# Patient Record
Sex: Female | Born: 1985 | Race: Black or African American | Hispanic: No | Marital: Single | Smoking: Never smoker
Health system: Southern US, Community
[De-identification: ages and names within clinical notes are randomized; demographics above are authoritative.]

## PROBLEM LIST (undated history)

## (undated) ENCOUNTER — Inpatient Hospital Stay (HOSPITAL_COMMUNITY): Payer: Self-pay

## (undated) DIAGNOSIS — O24419 Gestational diabetes mellitus in pregnancy, unspecified control: Secondary | ICD-10-CM

## (undated) DIAGNOSIS — I10 Essential (primary) hypertension: Secondary | ICD-10-CM

---

## 2009-02-10 HISTORY — PX: WISDOM TOOTH EXTRACTION: SHX21

## 2010-11-19 ENCOUNTER — Inpatient Hospital Stay (INDEPENDENT_AMBULATORY_CARE_PROVIDER_SITE_OTHER)
Admission: RE | Admit: 2010-11-19 | Discharge: 2010-11-19 | Disposition: A | Discharge status: Home / Self Care | Payer: Self-pay | Source: Ambulatory Visit | Attending: Emergency Medicine | Admitting: Emergency Medicine

## 2010-11-19 DIAGNOSIS — Z331 Pregnant state, incidental: Secondary | ICD-10-CM

## 2010-11-19 LAB — POCT URINALYSIS DIP (DEVICE)
Ketones, ur: NEGATIVE mg/dL
Protein, ur: NEGATIVE mg/dL
Specific Gravity, Urine: 1.02 (ref 1.005–1.030)
pH: 6 (ref 5.0–8.0)

## 2011-03-26 LAB — RPR: RPR: NONREACTIVE

## 2011-03-26 LAB — RUBELLA ANTIBODY, IGM: Rubella: IMMUNE

## 2011-03-26 LAB — ANTIBODY SCREEN: Antibody Screen: NEGATIVE

## 2011-04-16 ENCOUNTER — Encounter: Payer: Medicaid Other | Attending: Obstetrics and Gynecology

## 2011-04-16 ENCOUNTER — Ambulatory Visit: Payer: Medicaid Other | Admitting: *Deleted

## 2011-04-23 ENCOUNTER — Other Ambulatory Visit (INDEPENDENT_AMBULATORY_CARE_PROVIDER_SITE_OTHER): Payer: Medicaid Other

## 2011-04-23 ENCOUNTER — Encounter (INDEPENDENT_AMBULATORY_CARE_PROVIDER_SITE_OTHER): Payer: Medicaid Other | Admitting: Registered Nurse

## 2011-04-23 DIAGNOSIS — E669 Obesity, unspecified: Secondary | ICD-10-CM

## 2011-04-23 DIAGNOSIS — O358XX0 Maternal care for other (suspected) fetal abnormality and damage, not applicable or unspecified: Secondary | ICD-10-CM

## 2011-04-23 DIAGNOSIS — Z331 Pregnant state, incidental: Secondary | ICD-10-CM

## 2011-05-07 ENCOUNTER — Encounter (INDEPENDENT_AMBULATORY_CARE_PROVIDER_SITE_OTHER): Payer: Medicaid Other | Admitting: Registered Nurse

## 2011-05-07 ENCOUNTER — Observation Stay (HOSPITAL_COMMUNITY)
Admission: AD | Admit: 2011-05-07 | Discharge: 2011-05-09 | Disposition: A | Discharge status: Home / Self Care | Payer: Medicaid Other | Source: Ambulatory Visit | Attending: Obstetrics and Gynecology | Admitting: Obstetrics and Gynecology

## 2011-05-07 ENCOUNTER — Encounter (HOSPITAL_COMMUNITY): Payer: Self-pay | Admitting: *Deleted

## 2011-05-07 DIAGNOSIS — O99210 Obesity complicating pregnancy, unspecified trimester: Secondary | ICD-10-CM | POA: Diagnosis present

## 2011-05-07 DIAGNOSIS — Z331 Pregnant state, incidental: Secondary | ICD-10-CM

## 2011-05-07 DIAGNOSIS — Z91119 Patient's noncompliance with dietary regimen due to unspecified reason: Secondary | ICD-10-CM

## 2011-05-07 DIAGNOSIS — O24919 Unspecified diabetes mellitus in pregnancy, unspecified trimester: Secondary | ICD-10-CM

## 2011-05-07 DIAGNOSIS — O99810 Abnormal glucose complicating pregnancy: Principal | ICD-10-CM | POA: Insufficient documentation

## 2011-05-07 DIAGNOSIS — Z9111 Patient's noncompliance with dietary regimen: Secondary | ICD-10-CM

## 2011-05-07 DIAGNOSIS — E669 Obesity, unspecified: Secondary | ICD-10-CM | POA: Insufficient documentation

## 2011-05-07 HISTORY — DX: Gestational diabetes mellitus in pregnancy, unspecified control: O24.419

## 2011-05-07 LAB — COMPREHENSIVE METABOLIC PANEL
AST: 22 U/L (ref 0–37)
Alkaline Phosphatase: 78 U/L (ref 39–117)
BUN: 4 mg/dL — ABNORMAL LOW (ref 6–23)
CO2: 21 mEq/L (ref 19–32)
Chloride: 103 mEq/L (ref 96–112)
Creatinine, Ser: 0.56 mg/dL (ref 0.50–1.10)
GFR calc non Af Amer: >90 mL/min (ref >90)
Potassium: 3.5 mEq/L (ref 3.5–5.1)
Total Bilirubin: 0.4 mg/dL (ref 0.3–1.2)

## 2011-05-07 LAB — CBC
HCT: 32.2 % — ABNORMAL LOW (ref 36.0–46.0)
MCV: 80.7 fL (ref 78.0–100.0)
Platelets: 238 10*3/uL (ref 150–400)
RBC: 3.99 MIL/uL (ref 3.87–5.11)
WBC: 8.9 10*3/uL (ref 4.0–10.5)

## 2011-05-07 LAB — GLUCOSE, CAPILLARY
Glucose-Capillary: 143 mg/dL — ABNORMAL HIGH (ref 70–99)
Glucose-Capillary: 126 mg/dL — ABNORMAL HIGH (ref 70–99)

## 2011-05-07 MED ORDER — MENTHOL 3 MG MT LOZG
1.0000 | LOZENGE | OROMUCOSAL | Status: DC | PRN
  Administered 2011-05-08 – 2011-05-09 (×2): 3 mg via ORAL
  Filled 2011-05-07: qty 9

## 2011-05-07 MED ORDER — DOCUSATE SODIUM 100 MG PO CAPS
100.0000 mg | ORAL_CAPSULE | Freq: Every day | ORAL | Status: DC
  Administered 2011-05-08: 100 mg via ORAL
  Filled 2011-05-07: qty 1

## 2011-05-07 MED ORDER — GLYBURIDE 2.5 MG PO TABS
2.5000 mg | ORAL_TABLET | Freq: Every day | ORAL | Status: DC
  Administered 2011-05-08 – 2011-05-09 (×2): 2.5 mg via ORAL
  Filled 2011-05-07 (×3): qty 1

## 2011-05-07 MED ORDER — ACETAMINOPHEN 325 MG PO TABS
650.0000 mg | ORAL_TABLET | ORAL | Status: DC | PRN
  Filled 2011-05-07: qty 2

## 2011-05-07 MED ORDER — ZOLPIDEM TARTRATE 10 MG PO TABS: 10.0000 mg | ORAL_TABLET | Freq: Every evening | ORAL | Status: DC | PRN

## 2011-05-07 MED ORDER — INSULIN REGULAR HUMAN 100 UNIT/ML IJ SOLN
4.0000 [IU] | Freq: Four times a day (QID) | INTRAMUSCULAR | Status: DC | PRN
  Administered 2011-05-07: 4 [IU] via SUBCUTANEOUS
  Administered 2011-05-08: 6 [IU] via SUBCUTANEOUS
  Filled 2011-05-07: qty 0.1

## 2011-05-07 MED ORDER — PRENATAL MULTIVITAMIN CH
1.0000 | ORAL_TABLET | Freq: Every day | ORAL | Status: DC
  Administered 2011-05-08: 1 via ORAL
  Filled 2011-05-07: qty 1

## 2011-05-07 MED ORDER — CALCIUM CARBONATE ANTACID 500 MG PO CHEW: 2.0000 | CHEWABLE_TABLET | ORAL | Status: DC | PRN

## 2011-05-07 NOTE — Plan of Care (Signed)
Problem: Consults Goal: Nutrition Consult-if indicated Outcome: Progressing Diet Education completed 05/07/11

## 2011-05-07 NOTE — Progress Notes (Signed)
  Nutrition Dx: Food and nutrition-related knowledge deficit r/t no previous education aeb newly diagnosed GDM.   Nutrition education consult for Carbohydrate Modified Gestational Diabetic Diet completed.  "Meal  plan for gestational diabetics" handout given to patient.  Basic concepts reviewed.  Questions answered.  Patient verbalizes understanding.     

## 2011-05-07 NOTE — H&P (Signed)
Pam [name] is a 26 y.o. female, F1P0 at 74 1/7 weeks,  presenting for blood sugar monitoring and stabilization from the office.  Routine visit today showed glycosuria, with CBG of 295.  Patient had initial screening CBG earlier in pregnancy of 195--she was referred to Nutritional Management for management of dietary issues, but did not follow-up.  Pregnancy remarkable for: Morbid obesity Gestational diabetes, possibly Type 2 Late to care at 24-25 weeks.  History of present pregnancy: Patient entered care at 24 weeks.  EDC of was established by LMP, and in agreement with initial [place] at 25 weeks.   Further ultrasounds were done at 30 weeks, with normal findings.  Her prenatal course was essentially uncomplicated.   OB History    Grav Para Term Preterm Abortions TAB SAB Ect Mult Living   1 0 0 0 0 0 0 0 0 0      Past Medical History  Diagnosis Date  . Gestational diabetes    Past Surgical History  Procedure Date  . Wisdom tooth extraction 2011   Family History: family history is not on file. Social History:  reports that she has never smoked. She does not have any smokeless tobacco history on file. She reports that she does not drink alcohol or use illicit drugs.      Blood pressure 115/85, pulse 98, temperature 98.4 F (36.9 C), temperature source Oral, resp. rate 18, height 5\' 8"  (1.727 m), weight 304 lb (137.893 kg).  Chest clear Heart RRR without murmur Abd gravid, NT Ext WNL, 1+ edema  FHR reactice, no decels No contractions.  Prenatal labs: ABO, Rh:   Antibody:   Rubella:   RPR:    HBsAg:    HIV:    GBS:    Sickle cell   Assessment/Plan: IUP at 31 1/7 weeks Diabetes, gestational vs Type 2 Morbid obesity  Plan: Admit to Antenatal per consult with Dr. Normand Sloop CBG now and postprandial--if > or = 200, will give 10 units Regular insulin Nutritional consult Modified Carb diet NST TID Check CBC, CMET--will also get Hgb A1C Patient would prefer po med to  insulin--reviewed need for narrow blood sugar control for maternal/fetal safety, with utilization of po meds and insulin discussed.  Nigel Bridgeman 05/07/2011, 12:16 PM

## 2011-05-08 DIAGNOSIS — O24919 Unspecified diabetes mellitus in pregnancy, unspecified trimester: Secondary | ICD-10-CM

## 2011-05-08 DIAGNOSIS — Z9111 Patient's noncompliance with dietary regimen: Secondary | ICD-10-CM

## 2011-05-08 LAB — GLUCOSE, CAPILLARY
Glucose-Capillary: 114 mg/dL — ABNORMAL HIGH (ref 70–99)
Glucose-Capillary: 145 mg/dL — ABNORMAL HIGH (ref 70–99)

## 2011-05-08 NOTE — Progress Notes (Addendum)
ADA Standards of Care 2012:  Dabetes in Pregnancy Target Ranges: Fasting - 60-90 mg/dL  Preprandial- 40-981 mg/dL Postprandial < 191 mg/dL (7.8 mmol/L)  Please order a HgbA1C to establish initial control unless already performed. Plan to see patient this afternoon or tomorrow am.  Ordered RD consult and noted her visit and education. Have ordered education per bedside RN to assist patient to watch DM videos per System education network. Patient can practice monitoring her glucose as well with hospital meter. Medicaid will provide patient with Accucheck meter and strips; will need to write Rx for meter and monitoring q am, 2 hrs pp, and HS (? If desired). May also want to check a 2 am glucose while hospitalized to be sure she is not dropping and rebounding in the am. Urine ketone strips would also be helpful at home to check in the am when fasting glucose is elevated (greater than 100-120 mg/dL) Thank you, Lenor Coffin, RN, CNS, Diabetes Coordinator [phone]))

## 2011-05-08 NOTE — Progress Notes (Signed)
Pam [name] is a 26 y.o. G1P0000 at [redacted]w[redacted]d by ultrasound admitted for management on diabetes, cannot rule out pre-pregnancy diabetes  Subjective: GI: negative GU: Denies: dysuria, frequency/urgency, hematuria, genital discharge, vaginal bleeding, pelvic pain OB: good fetal movement        Objective: BP 127/68  Pulse 89  Temp(Src) 98.4 F (36.9 C) (Oral)  Resp 18  Ht 5\' 8"  (1.727 m)  Wt 304 lb (137.893 kg)  BMI 46.22 kg/m2    FBS 114  FHT:  FHR: 130's bpm, variability: moderate,  accelerations:  Present,  decelerations:  Absent UC:   none SVE:  deferred    Labs: Lab Results  Component Value Date   WBC 8.9 05/07/2011   HGB 11.0* 05/07/2011   HCT 32.2* 05/07/2011   MCV 80.7 05/07/2011   PLT 238 05/07/2011    Assessment / Plan: GDM cannont rule out pre-pregnancy diabetes Morbid obesity Noncompliance with recommended appointment to [facility]   Plan: Nutrition instructions given, pt voices understanding 1st dose glyburide given this am FBS and 2hr pc  Fetal Wellbeing:  Category II   Vaun Hyndman P 05/08/2011, 9:40 AM

## 2011-05-08 NOTE — Progress Notes (Signed)
UR Chart review completed.  

## 2011-05-08 NOTE — Progress Notes (Signed)
PT GIVEN GESTATIONAL DIABETES BOOKLET AND INSTRUCTED ON USE OF EDUCATION CHANNEL, AT PRESENT WATCHING THE VIDEO ON MANAGING DIABETES, INSTRUCTED ON WHICH SPECIFIC VIDEOS TO WATCH. INCLUDING CARB COUNTING.ALL QUESTIONS ANSWERED. PT VERBALIZED ALL UNDERSTANDING.

## 2011-05-09 DIAGNOSIS — O24919 Unspecified diabetes mellitus in pregnancy, unspecified trimester: Secondary | ICD-10-CM

## 2011-05-09 LAB — GLUCOSE, CAPILLARY
Glucose-Capillary: 95 mg/dL (ref 70–99)
Glucose-Capillary: 153 mg/dL — ABNORMAL HIGH (ref 70–99)
Glucose-Capillary: 75 mg/dL (ref 70–99)

## 2011-05-09 MED ORDER — GLYBURIDE 2.5 MG PO TABS
2.5000 mg | ORAL_TABLET | ORAL | Status: AC
  Administered 2011-05-09: 2.5 mg via ORAL
  Filled 2011-05-09: qty 1

## 2011-05-09 MED ORDER — GLYBURIDE 5 MG PO TABS: 5.0000 mg | ORAL_TABLET | Freq: Every day | ORAL | Status: DC

## 2011-05-09 NOTE — Progress Notes (Signed)
ADA Standards of Care 2012:  Dabetes in Pregnancy Target Ranges: Fasting - 60-90 mg/dL  Preprandial- 16-109 mg/dL Postprandial < 604 mg/dL (7.8 mmol/L)  Reviewed glucose levels.  Might need to increase the diabeta to 5 mg in the am to cover the meals, but overall the glucose levels are close to target.  Pt will need to monitor closely, as her potential need for insulin is probably soon to be a factor.  Glad to help.  Would suggest starting with NPH at HS if 5 mg Diabeta does not normalize fasting gluocose less than or equal to 90 mg/dL Thank you, Lenor Coffin, RN, CNS, Diabetes Coordinator [phone])  .

## 2011-05-09 NOTE — Discharge Instructions (Signed)
Gestational Diabetes Mellitus Gestational diabetes mellitus (GDM) is diabetes that occurs only during pregnancy. This happens when the body cannot properly handle the glucose (sugar) that increases in the blood after eating. During pregnancy, insulin resistance (reduced sensitivity to insulin) occurs because of the release of hormones from the placenta. Usually, the pancreas of pregnant women produces enough insulin to overcome the resistance that occurs. However, in gestational diabetes, the insulin is there but it does not work effectively. If the resistance is severe enough that the pancreas does not produce enough insulin, extra glucose builds up in the blood.  WHO IS AT RISK FOR DEVELOPING GESTATIONAL DIABETES?  Women with a history of diabetes in the family.   Women over age 25.   Women who are overweight.   Women in certain ethnic groups (Hispanic, African American, Native American, Asian and Pacific Islander).  WHAT CAN HAPPEN TO THE BABY? If the mother's blood glucose is too high while she is pregnant, the extra sugar will travel through the umbilical cord to the baby. Some of the problems the baby may have are:  Large Baby - If the baby receives too much sugar, the baby will gain more weight. This may cause the baby to be too large to be born normally (vaginally) and a Cesarean section (C-section) may be needed.   Low Blood Glucose (hypoglycemia) - The baby makes extra insulin, in response to the extra sugar its gets from its mother. When the baby is born and no longer needs this extra insulin, the baby's blood glucose level may drop.   Jaundice (yellow coloring of the skin and eyes) - This is fairly common in babies. It is caused from a build-up of the chemical called bilirubin. This is rarely serious, but is seen more often in babies whose mothers had gestational diabetes.  RISKS TO THE MOTHER Women who have had gestational diabetes may be at higher risk for some problems,  including:  Preeclampsia or toxemia, which includes problems with high blood pressure. Blood pressure and protein levels in the urine must be checked frequently.   Infections.   Cesarean section (C-section) for delivery.   Developing Type 2 diabetes later in life. About 30-50% will develop diabetes later, especially if obese.  DIAGNOSIS  The hormones that cause insulin resistance are highest at about 24-28 weeks of pregnancy. If symptoms are experienced, they are much like symptoms you would normally expect during pregnancy.  GDM is often diagnosed using a two part method: 1. After 24-28 weeks of pregnancy, the woman drinks a glucose solution and takes a blood test. If the glucose level is high, a second test will be given.  2. Oral Glucose Tolerance Test (OGTT) which is 3 hours long - After not eating overnight, the blood glucose is checked. The woman drinks a glucose solution, and hourly blood glucose tests are taken.  If the woman has risk factors for GDM, the caregiver may test earlier than 24 weeks of pregnancy. TREATMENT  Treatment of GDM is directed at keeping the mother's blood glucose level normal, and may include:  Meal planning.   Taking insulin or other medicine to control your blood glucose level.   Exercise.   Keeping a daily record of the foods you eat.   Blood glucose monitoring and keeping a record of your blood glucose levels.   May monitor ketone levels in the urine, although this is no longer considered necessary in most pregnancies.  HOME CARE INSTRUCTIONS  While you are pregnant:    Follow your caregiver's advice regarding your prenatal appointments, meal planning, exercise, medicines, vitamins, blood and other tests, and physical activities.   Keep a record of your meals, blood glucose tests, and the amount of insulin you are taking (if any). Show this to your caregiver at every prenatal visit.   If you have GDM, you may have problems with hypoglycemia (low  blood glucose). You may suspect this if you become suddenly dizzy, feel shaky, and/or weak. If you think this is happening and you have a glucose meter, try to test your blood glucose level. Follow your caregiver's advice for when and how to treat your low blood glucose. Generally, the 15:15 rule is followed: Treat by consuming 15 grams of carbohydrates, wait 15 minutes, and recheck blood glucose. Examples of 15 grams of carbohydrates are:   1 cup skim or low-fat milk.    cup juice.   3-4 glucose tablets.   5-6 hard candies.   1 small box raisins.    cup regular soda pop.   Practice good hygiene, to avoid infections.   Do not smoke.  SEEK MEDICAL CARE IF:   You develop abnormal vaginal discharge, with or without itching.   You become weak and tired more than expected.   You seem to sweat a lot.   You have a sudden increase in weight, 5 pounds or more in one week.   You are losing weight, 3 pounds or more in a week.   Your blood glucose level is high, and you need instructions on what to do about it.  SEEK IMMEDIATE MEDICAL CARE IF:   You develop a severe headache.   You faint or pass out.   You develop nausea and vomiting.   You become disoriented or confused.   You have a convulsion.   You develop vision problems.   You develop stomach pain.   You develop vaginal bleeding.   You develop uterine contractions.   You have leaking or a gush of fluid from the vagina.  AFTER YOU HAVE THE BABY:  Go to all of your follow-up appointments, and have blood tests as advised by your caregiver.   Maintain a healthy lifestyle, to prevent diabetes in the future. This includes:   Following a healthy meal plan.   Controlling your weight.   Getting enough exercise and proper rest.   Do not smoke.   Breastfeed your baby if you can. This will lower the chance of you and your baby developing diabetes later in life.  For more information about diabetes, go to the American  Diabetes Association at: www.americandiabetesassociation.org. For more information about gestational diabetes, go to the American Congress of Obstetricians and Gynecologists at: www.acog.org. Document Released: 05/05/2000 Document Revised: 01/16/2011 Document Reviewed: 11/27/2008 ExitCare Patient Information 2012 [employer]. 

## 2011-05-09 NOTE — Discharge Summary (Signed)
Physician Discharge Summary  Patient ID: Pam [name] MRN: [id number] DOB/AGE: May 17, 1985 26 y.o.  Admit date: 05/07/2011 Discharge date: 05/09/2011  Admission Diagnoses:  31 3/7 weeks                                          Elevated blood sugar                                           Morbid obesity                                          Dietary non-compliance  Discharge Diagnoses:  Active Problems:  Diabetes in pregnancy  Maternal morbid obesity, antepartum  Dietary noncompliance   Discharged Condition: stable  Hospital Course: Admitted on 05/07/11 from the office with a CBG of 295.  Had previously had a CBG of 190, with referral to [facility] in lieu of further diabetic testing.  Patient had not been checking her blood sugars and had not been following any dietary guidelines for glucose management. During her admission, CBGs were monitored and the patient was begun on Glyburide 2.5 mg po q day.  Her fasting CBGs were generally WNL, but her 2 hour pp were mildly elevated (see hospital notes).  FHR was reassuring on NST TID, and other VS were WNL.  Nutritional consult and dietary teaching was completed, and the diabetic CNS also saw the patient.  On 05/09/11, patient demonstrated the ability to check her CBG without difficulty.  Dr. Stefano Gaul was consulted, and the patient was discharged home in stable condition.  She was to monitor her CBGs fasting and 2 hour pp, take Glyburide 5 mg po q day, maintain dietary control of carbohydrate intake, and follow-up in the office in 1 week.  Patient seemed to understand these instructions and was discharged home.  Consults: Nutritionist, diabetic CNS  Significant Diagnostic Studies: labs: CBGs  Treatments: None  Discharge Exam: Blood pressure 123/73, pulse 106, temperature 98.3 F (36.8 C), temperature source Oral, resp. rate 18, height 5\' 8"  (1.727 m), weight 304 lb (137.893 kg). General appearance: alert Chest wall: no tenderness Breasts: normal  appearance, no masses or tenderness Cardio: regular rate and rhythm, S1, S2 normal, no murmur, click, rub or gallop GI: gravid, NT Extremities: extremities normal, atraumatic, no cyanosis or edema FHR reactive on am tracing, no significant contrations  Disposition: Stable  Medication List  As of 05/09/2011 12:52 PM   TAKE these medications         glyBURIDE 5 MG tablet   Commonly known as: DIABETA   Take 1 tablet (5 mg total) by mouth daily with breakfast.      prenatal multivitamin Tabs   Take 1 tablet by mouth daily.           Follow-up Information    Follow up with [facility] in 1 week. (Office will call you on Monday to make appointment for later this week..  Call with any questions.)          Signed: Nigel Bridgeman 05/09/2011, 12:52 PM

## 2011-05-09 NOTE — Progress Notes (Signed)
Hospital day # 2 pregnancy at [redacted]w[redacted]d--admitted for glucose control  S: Well, reports good fetal activity      Contractions: None per patient      Vaginal bleeding: None       Vaginal discharge: None         On Glyburide 2.5 mg po q day with breakfast  O: BP 139/81  Pulse 96  Temp(Src) 98.4 F (36.9 C) (Oral)  Resp 20  Ht 5\' 8"  (1.727 m)  Wt 304 lb (137.893 kg)  BMI 46.22 kg/m2      Fetal tracings: Reassuring, with scattered accels on TID NST, occasional mild variables      Mild irritability noted      Uterus non-tender      Extremities: no significant edema and no signs of DVT  Results for DAVIELLE, LINGELBACH (MRN [id number]) as of 05/09/2011 11:36  Ref. Range 05/07/2011 12:08 05/07/2011 15:33 05/07/2011 20:03 05/08/2011 06:01 05/08/2011 10:47 05/08/2011 15:28 05/08/2011 20:48 05/09/2011 06:03 05/09/2011 10:41  GlucoseCapillary Latest Range: 70-99 mg/dL 409 (H) 811 (H) 914 (H) 114 (H) 145 (H) 74 114 (H) 95 153 (H)   FBS range 95-114 2 hr pp breakfast 145-153 2 hr pp lunch 74-143 2 hr pp dinner 114-126  A: [redacted]w[redacted]d with diabetes--gestational vs Type 2     On glyburide since 05/07/11     Previous elevated CBG--non-compliant with monitoring  P:  Reviewed status with patient--she hopes for d/c today.       Has not performed self CBGs.  Seems to understand rationale for CBG control and dietary management.       Will consult with Dr. Stefano Gaul regarding plan.  Nigel Bridgeman  MD 05/09/2011 11:31 AM

## 2011-06-02 ENCOUNTER — Encounter: Payer: Medicaid Other | Admitting: Obstetrics and Gynecology

## 2011-06-12 ENCOUNTER — Encounter: Payer: Self-pay | Admitting: Obstetrics and Gynecology

## 2011-06-16 ENCOUNTER — Telehealth: Payer: Self-pay | Admitting: Obstetrics and Gynecology

## 2011-06-16 NOTE — Telephone Encounter (Signed)
cht found for tom.

## 2011-06-17 ENCOUNTER — Encounter: Payer: Self-pay | Admitting: Obstetrics and Gynecology

## 2011-06-17 ENCOUNTER — Ambulatory Visit (INDEPENDENT_AMBULATORY_CARE_PROVIDER_SITE_OTHER): Payer: Medicaid Other | Admitting: Obstetrics and Gynecology

## 2011-06-17 VITALS — BP 118/78 | Wt 300.0 lb

## 2011-06-17 DIAGNOSIS — O99810 Abnormal glucose complicating pregnancy: Secondary | ICD-10-CM

## 2011-06-17 DIAGNOSIS — O24919 Unspecified diabetes mellitus in pregnancy, unspecified trimester: Secondary | ICD-10-CM

## 2011-06-17 DIAGNOSIS — Z331 Pregnant state, incidental: Secondary | ICD-10-CM

## 2011-06-17 DIAGNOSIS — O24419 Gestational diabetes mellitus in pregnancy, unspecified control: Secondary | ICD-10-CM

## 2011-06-17 LAB — OB RESULTS CONSOLE GBS: GBS: POSITIVE

## 2011-06-17 LAB — POCT CBG (FASTING - GLUCOSE)-MANUAL ENTRY: Glucose Fasting, POC: 114 mg/dL — AB (ref 70–99)

## 2011-06-17 MED ORDER — TERCONAZOLE 0.4 % VA CREA: 1.0000 | TOPICAL_CREAM | Freq: Every day | VAGINAL | Status: AC

## 2011-06-17 MED ORDER — FREESTYLE SYSTEM KIT: 1.0000 | PACK | Status: DC | PRN

## 2011-06-17 MED ORDER — GLUCOSE BLOOD VI STRP: ORAL_STRIP | Status: DC

## 2011-06-17 NOTE — Progress Notes (Signed)
Pt unable to purchase glucose monitor to check CBGs

## 2011-06-17 NOTE — Progress Notes (Signed)
A/P GBS done with cultures Fetal kick counts reviewed Labor reviewed with pt All patients  questions answered rx sent for glucometer and test strips Pt told risks of maternal and fetal morbidity if blood sugar not checked. Check cbg this visit ate two hours ago terazol for yeast vaginitis CBG 116

## 2011-06-17 NOTE — Progress Notes (Signed)
Addended by: Rolla Plate on: 06/17/2011 10:55 AM   Modules accepted: Orders

## 2011-06-17 NOTE — Patient Instructions (Signed)
Fetal Movement Counts Patient Name: __________________________________________________ Patient Due Date: ____________________ Kick counts is highly recommended in high risk pregnancies, but it is a good idea for every pregnant woman to do. Start counting fetal movements at 28 weeks of the pregnancy. Fetal movements increase after eating a full meal or eating or drinking something sweet (the blood sugar is higher). It is also important to drink plenty of fluids (well hydrated) before doing the count. Lie on your left side because it helps with the circulation or you can sit in a comfortable chair with your arms over your belly (abdomen) with no distractions around you. DOING THE COUNT  Try to do the count the same time of day each time you do it.   Mark the day and time, then see how long it takes for you to feel 10 movements (kicks, flutters, swishes, rolls). You should have at least 10 movements within 2 hours. You will most likely feel 10 movements in much less than 2 hours. If you do not, wait an hour and count again. After a couple of days you will see a pattern.   What you are looking for is a change in the pattern or not enough counts in 2 hours. Is it taking longer in time to reach 10 movements?  SEEK MEDICAL CARE IF:  You feel less than 10 counts in 2 hours. Tried twice.   No movement in one hour.   The pattern is changing or taking longer each day to reach 10 counts in 2 hours.   You feel the baby is not moving as it usually does.  Date: ____________ Movements: ____________ Start time: ____________ Finish time: ____________  Date: ____________ Movements: ____________ Start time: ____________ Finish time: ____________ Date: ____________ Movements: ____________ Start time: ____________ Finish time: ____________ Date: ____________ Movements: ____________ Start time: ____________ Finish time: ____________ Date: ____________ Movements: ____________ Start time: ____________ Finish time:  ____________ Date: ____________ Movements: ____________ Start time: ____________ Finish time: ____________ Date: ____________ Movements: ____________ Start time: ____________ Finish time: ____________ Date: ____________ Movements: ____________ Start time: ____________ Finish time: ____________  Date: ____________ Movements: ____________ Start time: ____________ Finish time: ____________ Date: ____________ Movements: ____________ Start time: ____________ Finish time: ____________ Date: ____________ Movements: ____________ Start time: ____________ Finish time: ____________ Date: ____________ Movements: ____________ Start time: ____________ Finish time: ____________ Date: ____________ Movements: ____________ Start time: ____________ Finish time: ____________ Date: ____________ Movements: ____________ Start time: ____________ Finish time: ____________ Date: ____________ Movements: ____________ Start time: ____________ Finish time: ____________  Date: ____________ Movements: ____________ Start time: ____________ Finish time: ____________ Date: ____________ Movements: ____________ Start time: ____________ Finish time: ____________ Date: ____________ Movements: ____________ Start time: ____________ Finish time: ____________ Date: ____________ Movements: ____________ Start time: ____________ Finish time: ____________ Date: ____________ Movements: ____________ Start time: ____________ Finish time: ____________ Date: ____________ Movements: ____________ Start time: ____________ Finish time: ____________ Date: ____________ Movements: ____________ Start time: ____________ Finish time: ____________  Date: ____________ Movements: ____________ Start time: ____________ Finish time: ____________ Date: ____________ Movements: ____________ Start time: ____________ Finish time: ____________ Date: ____________ Movements: ____________ Start time: ____________ Finish time: ____________ Date: ____________ Movements:  ____________ Start time: ____________ Finish time: ____________ Date: ____________ Movements: ____________ Start time: ____________ Finish time: ____________ Date: ____________ Movements: ____________ Start time: ____________ Finish time: ____________ Date: ____________ Movements: ____________ Start time: ____________ Finish time: ____________  Date: ____________ Movements: ____________ Start time: ____________ Finish time: ____________ Date: ____________ Movements: ____________ Start time: ____________ Finish time: ____________ Date: ____________ Movements: ____________ Start time:   ____________ Finish time: ____________ Date: ____________ Movements: ____________ Start time: ____________ Finish time: ____________ Date: ____________ Movements: ____________ Start time: ____________ Finish time: ____________ Date: ____________ Movements: ____________ Start time: ____________ Finish time: ____________ Date: ____________ Movements: ____________ Start time: ____________ Finish time: ____________  Date: ____________ Movements: ____________ Start time: ____________ Finish time: ____________ Date: ____________ Movements: ____________ Start time: ____________ Finish time: ____________ Date: ____________ Movements: ____________ Start time: ____________ Finish time: ____________ Date: ____________ Movements: ____________ Start time: ____________ Finish time: ____________ Date: ____________ Movements: ____________ Start time: ____________ Finish time: ____________ Date: ____________ Movements: ____________ Start time: ____________ Finish time: ____________ Date: ____________ Movements: ____________ Start time: ____________ Finish time: ____________  Date: ____________ Movements: ____________ Start time: ____________ Finish time: ____________ Date: ____________ Movements: ____________ Start time: ____________ Finish time: ____________ Date: ____________ Movements: ____________ Start time: ____________ Finish  time: ____________ Date: ____________ Movements: ____________ Start time: ____________ Finish time: ____________ Date: ____________ Movements: ____________ Start time: ____________ Finish time: ____________ Date: ____________ Movements: ____________ Start time: ____________ Finish time: ____________ Date: ____________ Movements: ____________ Start time: ____________ Finish time: ____________  Date: ____________ Movements: ____________ Start time: ____________ Finish time: ____________ Date: ____________ Movements: ____________ Start time: ____________ Finish time: ____________ Date: ____________ Movements: ____________ Start time: ____________ Finish time: ____________ Date: ____________ Movements: ____________ Start time: ____________ Finish time: ____________ Date: ____________ Movements: ____________ Start time: ____________ Finish time: ____________ Date: ____________ Movements: ____________ Start time: ____________ Finish time: ____________ Document Released: 02/26/2006 Document Revised: 01/16/2011 Document Reviewed: 08/29/2008 ExitCare Patient Information 2012 [employer]. 

## 2011-06-22 LAB — CULTURE, BETA STREP (GROUP B ONLY)

## 2011-06-24 ENCOUNTER — Ambulatory Visit (INDEPENDENT_AMBULATORY_CARE_PROVIDER_SITE_OTHER): Payer: Medicaid Other | Admitting: Obstetrics and Gynecology

## 2011-06-24 ENCOUNTER — Ambulatory Visit (INDEPENDENT_AMBULATORY_CARE_PROVIDER_SITE_OTHER): Payer: Medicaid Other

## 2011-06-24 ENCOUNTER — Other Ambulatory Visit: Payer: Self-pay | Admitting: Obstetrics and Gynecology

## 2011-06-24 ENCOUNTER — Other Ambulatory Visit: Payer: Self-pay

## 2011-06-24 ENCOUNTER — Encounter: Payer: Self-pay | Admitting: Obstetrics and Gynecology

## 2011-06-24 VITALS — BP 128/80 | Wt 307.0 lb

## 2011-06-24 DIAGNOSIS — Z331 Pregnant state, incidental: Secondary | ICD-10-CM

## 2011-06-24 DIAGNOSIS — O24919 Unspecified diabetes mellitus in pregnancy, unspecified trimester: Secondary | ICD-10-CM

## 2011-06-24 DIAGNOSIS — O0990 Supervision of high risk pregnancy, unspecified, unspecified trimester: Secondary | ICD-10-CM

## 2011-06-24 LAB — US OB FOLLOW UP

## 2011-06-24 NOTE — Progress Notes (Signed)
Addended by: Janine Limbo on: 06/24/2011 02:18 PM   Modules accepted: Orders, Level of Service

## 2011-06-24 NOTE — Progress Notes (Signed)
Patient ID: Pam [name], female   DOB: 1985/10/25, 26 y.o.   MRN: [id number] GBS positive.  Plan to treat in labor

## 2011-06-24 NOTE — Progress Notes (Signed)
The patient has not checked her blood sugars because she does not have a monitor.  She is scheduled to see the nutritionist tomorrow and a free monitor will be provided.  She takes glyburide 5 mg each day.  Ultrasound: Single gestation, vertex, normal fluid, 71st percentile for growth.  BPP 8 out of 8.  Return office in one week.  BPP next week.  The patient will monitor her blood sugars.  Dr. Stefano Gaul

## 2011-07-02 ENCOUNTER — Encounter: Payer: Medicaid Other | Attending: Obstetrics and Gynecology

## 2011-07-02 NOTE — Telephone Encounter (Signed)
Jackie/AR pt/missed appt today

## 2011-07-03 ENCOUNTER — Telehealth: Payer: Self-pay | Admitting: Obstetrics and Gynecology

## 2011-07-03 NOTE — Telephone Encounter (Signed)
Pt did not keep appt.  Message has been left for pt to R/S but has not called to do so.

## 2011-07-04 ENCOUNTER — Ambulatory Visit (INDEPENDENT_AMBULATORY_CARE_PROVIDER_SITE_OTHER): Payer: Medicaid Other | Admitting: Obstetrics and Gynecology

## 2011-07-04 ENCOUNTER — Other Ambulatory Visit: Payer: Self-pay | Admitting: Obstetrics and Gynecology

## 2011-07-04 ENCOUNTER — Other Ambulatory Visit (INDEPENDENT_AMBULATORY_CARE_PROVIDER_SITE_OTHER): Payer: Medicaid Other

## 2011-07-04 VITALS — BP 114/70 | Wt 305.0 lb

## 2011-07-04 DIAGNOSIS — O99810 Abnormal glucose complicating pregnancy: Secondary | ICD-10-CM

## 2011-07-04 DIAGNOSIS — O24419 Gestational diabetes mellitus in pregnancy, unspecified control: Secondary | ICD-10-CM

## 2011-07-04 NOTE — Progress Notes (Signed)
No complaints GBS + Ultrasound today with an AFI of 15.2 cm fetal heart rate 135, vertex, posterior placenta, normal fluid, biophysical profile 8 out of 8 Return to office in one week for biophysical profile and visit and will start the Induction scheduling process for 41 weeks. GDM on 5 mg of glyburide Q am.  Patient does not have CBCs with her.  She just started taking them yesterday.

## 2011-07-04 NOTE — Progress Notes (Signed)
Pt has no complaints. Pt wants cx checked/ JM

## 2011-07-08 ENCOUNTER — Telehealth: Payer: Self-pay

## 2011-07-08 NOTE — Telephone Encounter (Signed)
LM for pt to cb re: her missing her appt with [facility] for nutritional counselling. On the 22nd May. I asked her to call me to get her r/s. Melody Comas A

## 2011-07-10 ENCOUNTER — Telehealth: Payer: Self-pay | Admitting: Obstetrics and Gynecology

## 2011-07-10 ENCOUNTER — Ambulatory Visit (INDEPENDENT_AMBULATORY_CARE_PROVIDER_SITE_OTHER): Payer: Medicaid Other | Admitting: Obstetrics and Gynecology

## 2011-07-10 ENCOUNTER — Encounter: Payer: Self-pay | Admitting: Obstetrics and Gynecology

## 2011-07-10 ENCOUNTER — Inpatient Hospital Stay (HOSPITAL_COMMUNITY)
Admission: AD | Admit: 2011-07-10 | Discharge: 2011-07-15 | DRG: 765 | Disposition: A | Discharge status: Home / Self Care | Payer: Medicaid Other | Source: Ambulatory Visit | Attending: Obstetrics and Gynecology | Admitting: Obstetrics and Gynecology

## 2011-07-10 ENCOUNTER — Encounter (HOSPITAL_COMMUNITY): Payer: Self-pay | Admitting: *Deleted

## 2011-07-10 VITALS — BP 120/80 | Wt 305.0 lb

## 2011-07-10 DIAGNOSIS — O99810 Abnormal glucose complicating pregnancy: Secondary | ICD-10-CM

## 2011-07-10 DIAGNOSIS — Z22330 Carrier of Group B streptococcus: Secondary | ICD-10-CM

## 2011-07-10 DIAGNOSIS — E119 Type 2 diabetes mellitus without complications: Secondary | ICD-10-CM | POA: Diagnosis present

## 2011-07-10 DIAGNOSIS — Z331 Pregnant state, incidental: Secondary | ICD-10-CM | POA: Insufficient documentation

## 2011-07-10 DIAGNOSIS — E669 Obesity, unspecified: Secondary | ICD-10-CM | POA: Diagnosis present

## 2011-07-10 DIAGNOSIS — O9902 Anemia complicating childbirth: Secondary | ICD-10-CM | POA: Diagnosis present

## 2011-07-10 DIAGNOSIS — O2432 Unspecified pre-existing diabetes mellitus in childbirth: Secondary | ICD-10-CM | POA: Diagnosis present

## 2011-07-10 DIAGNOSIS — O24919 Unspecified diabetes mellitus in pregnancy, unspecified trimester: Secondary | ICD-10-CM | POA: Diagnosis present

## 2011-07-10 DIAGNOSIS — O24419 Gestational diabetes mellitus in pregnancy, unspecified control: Secondary | ICD-10-CM

## 2011-07-10 DIAGNOSIS — D573 Sickle-cell trait: Secondary | ICD-10-CM | POA: Diagnosis present

## 2011-07-10 DIAGNOSIS — O99892 Other specified diseases and conditions complicating childbirth: Secondary | ICD-10-CM | POA: Diagnosis present

## 2011-07-10 DIAGNOSIS — O99820 Streptococcus B carrier state complicating pregnancy: Secondary | ICD-10-CM

## 2011-07-10 DIAGNOSIS — Z98891 History of uterine scar from previous surgery: Secondary | ICD-10-CM

## 2011-07-10 LAB — COMPREHENSIVE METABOLIC PANEL
ALT: 16 U/L (ref 0–35)
AST: 17 U/L (ref 0–37)
Albumin: 2.3 g/dL — ABNORMAL LOW (ref 3.5–5.2)
Alkaline Phosphatase: 147 U/L — ABNORMAL HIGH (ref 39–117)
BUN: 7 mg/dL (ref 6–23)
Chloride: 101 mEq/L (ref 96–112)
Potassium: 4.2 mEq/L (ref 3.5–5.1)
Sodium: 135 mEq/L (ref 135–145)
Total Bilirubin: 0.4 mg/dL (ref 0.3–1.2)

## 2011-07-10 LAB — BASIC METABOLIC PANEL
Calcium: 9.1 mg/dL (ref 8.4–10.5)
Creatinine, Ser: 0.74 mg/dL (ref 0.50–1.10)
GFR calc non Af Amer: >90 mL/min (ref >90)
Sodium: 135 mEq/L (ref 135–145)

## 2011-07-10 LAB — CBC
MCHC: 33.5 g/dL (ref 30.0–36.0)
RDW: 14.1 % (ref 11.5–15.5)

## 2011-07-10 LAB — GLUCOSE, CAPILLARY: Glucose-Capillary: 62 mg/dL — ABNORMAL LOW (ref 70–99)

## 2011-07-10 LAB — RPR: RPR Ser Ql: NONREACTIVE

## 2011-07-10 MED ORDER — LACTATED RINGERS IV SOLN
INTRAVENOUS | Status: DC
  Administered 2011-07-10 – 2011-07-11 (×2): via INTRAVENOUS
  Administered 2011-07-11: 125 mL/h via INTRAVENOUS

## 2011-07-10 MED ORDER — ZOLPIDEM TARTRATE 10 MG PO TABS: 10.0000 mg | ORAL_TABLET | Freq: Every evening | ORAL | Status: DC | PRN

## 2011-07-10 MED ORDER — OXYTOCIN BOLUS FROM INFUSION
500.0000 mL | Freq: Once | INTRAVENOUS | Status: DC
  Filled 2011-07-10: qty 500

## 2011-07-10 MED ORDER — MISOPROSTOL 25 MCG QUARTER TABLET
25.0000 ug | ORAL_TABLET | ORAL | Status: AC | PRN
  Administered 2011-07-10 – 2011-07-11 (×4): 25 ug via VAGINAL
  Filled 2011-07-10 (×4): qty 0.25

## 2011-07-10 MED ORDER — PENICILLIN G POTASSIUM 5000000 UNITS IJ SOLR
5.0000 10*6.[IU] | Freq: Once | INTRAVENOUS | Status: AC
  Administered 2011-07-10: 5 10*6.[IU] via INTRAVENOUS
  Filled 2011-07-10: qty 5

## 2011-07-10 MED ORDER — ACETAMINOPHEN 325 MG PO TABS: 650.0000 mg | ORAL_TABLET | ORAL | Status: DC | PRN

## 2011-07-10 MED ORDER — ONDANSETRON HCL 4 MG/2ML IJ SOLN: 4.0000 mg | Freq: Four times a day (QID) | INTRAMUSCULAR | Status: DC | PRN

## 2011-07-10 MED ORDER — DEXTROSE 5 % IV SOLN
2.5000 10*6.[IU] | INTRAVENOUS | Status: DC
  Administered 2011-07-10 – 2011-07-12 (×13): 2.5 10*6.[IU] via INTRAVENOUS
  Filled 2011-07-10 (×16): qty 2.5

## 2011-07-10 MED ORDER — BUTORPHANOL TARTRATE 2 MG/ML IJ SOLN: 1.0000 mg | INTRAMUSCULAR | Status: DC | PRN

## 2011-07-10 MED ORDER — TERBUTALINE SULFATE 1 MG/ML IJ SOLN: 0.2500 mg | Freq: Once | INTRAMUSCULAR | Status: AC | PRN

## 2011-07-10 MED ORDER — CITRIC ACID-SODIUM CITRATE 334-500 MG/5ML PO SOLN
30.0000 mL | ORAL | Status: DC | PRN
  Administered 2011-07-12: 30 mL via ORAL
  Filled 2011-07-10: qty 15

## 2011-07-10 MED ORDER — OXYTOCIN 20 UNITS IN LACTATED RINGERS INFUSION - SIMPLE: 125.0000 mL/h | Freq: Once | INTRAVENOUS | Status: DC

## 2011-07-10 MED ORDER — LACTATED RINGERS IV SOLN
500.0000 mL | INTRAVENOUS | Status: DC | PRN
  Administered 2011-07-12 (×2): 1000 mL via INTRAVENOUS

## 2011-07-10 MED ORDER — IBUPROFEN 600 MG PO TABS: 600.0000 mg | ORAL_TABLET | Freq: Four times a day (QID) | ORAL | Status: DC | PRN

## 2011-07-10 MED ORDER — FLEET ENEMA 7-19 GM/118ML RE ENEM: 1.0000 | ENEMA | RECTAL | Status: DC | PRN

## 2011-07-10 MED ORDER — OXYCODONE-ACETAMINOPHEN 5-325 MG PO TABS: 1.0000 | ORAL_TABLET | ORAL | Status: DC | PRN

## 2011-07-10 MED ORDER — LIDOCAINE HCL (PF) 1 % IJ SOLN: 30.0000 mL | INTRAMUSCULAR | Status: DC | PRN

## 2011-07-10 MED ORDER — OXYTOCIN 20 UNITS IN LACTATED RINGERS INFUSION - SIMPLE: 1.0000 m[IU]/min | INTRAVENOUS | Status: DC

## 2011-07-10 NOTE — Progress Notes (Signed)
A/P GBS positive Pt with GDM and noncompliant.  To MAU for induction.  Pt understands risks of c/s S>d t/c growth US Fetal kick counts reviewed Labor reviewed with pt All patients  questions answered

## 2011-07-10 NOTE — Progress Notes (Signed)
Patient ID: Pam [name], female   DOB: 1986/02/01, 26 y.o.   MRN: [id number] .Subjective: Denies any pain, occ feels a ctx, has eaten dinner, denies any HA/N/V/RUQ pain   Objective: BP 145/97  Pulse 87  Temp(Src) 98.6 F (37 C) (Oral)  Resp 18  Ht 5\' 8"  (1.727 m)  Wt 305 lb (138.347 kg)  BMI 46.38 kg/m2  LMP 10/01/2010  Filed Vitals:   07/10/11 1427 07/10/11 1618 07/10/11 1758 07/10/11 1927  BP:  144/92 119/80 145/97  Pulse:  76 85 87  Temp:  97.8 F (36.6 C)  98.6 F (37 C)  TempSrc:  Oral  Oral  Resp:  20 22 18   Height: 5\' 8"  (1.727 m)     Weight: 305 lb (138.347 kg)      CBG (last 3)   Basename 07/10/11 1959 07/10/11 1600 07/10/11 1122  GLUCAP 62* 76 120*      FHT:  FHR: 130 bpm, variability: moderate,  accelerations:  Present,  decelerations:  Absent UC:   irregular, SVE:   Dilation: Closed Effacement (%): 50 Station: -2 Exam by:: Sowder,rNC   Assessment / Plan: IOL secondary to GDM/type 2?  GBS pos, on PCN Has rcv'd 2 doses cytotec next one due at 10pm Will cont w cytotec overnight and reeval in the AM for pitocin CBG's stable A couple isolated high BP's, rv'd labs and BMP was normal, will add CMP and uric acid CTO closely, may consider starting 24hour urine if BP remains elevated    Fetal Wellbeing:  Category I Pain Control:  n/a  Update physician PRN  Malissa Hippo 07/10/2011, 9:35 PM

## 2011-07-10 NOTE — H&P (Signed)
Pam [name] is a 26 y.o. female , G1P0 at 47 2/7 weeks presents for induction due to probable Type 2 diabetes, poor compliance with CBG monitoring and diet (but has been taking Glyburide as ordered).  Seen at [facility] today by Dr. Normand Sloop, with BPP 8/8, cervix long and closed, with pp high.  Had not yet been scheduled for induction, so was sent today for admission.  Pregnancy remarkable for:  Morbid obesity  Probable Type 2 diabetes (vs gestational diabetes), with poor compliance with monitoring and diet Late to care at 24-25 weeks.  Positive GBS Sickle cell trait  History of present pregnancy:  Patient entered care at 24 weeks. EDC of was established by LMP, and in agreement with initial [place] at 25 weeks. Further ultrasounds were done at 30 weeks, with normal findings.  She was admitted to [facility] at 31 weeks for a CBG of 245, with monitoring of CBGs and initiation of glyburide at that time.  She has continued on Glyburide 5 mg po q day. She has not been monitoring her blood sugars, but has taken the med.  EFW was 7+14 (71%ile)  2 weeks ago, with nomal fluid.  GBS was positive at 37 weeks.   OB History    Grav Para Term Preterm Abortions TAB SAB Ect Mult Living   1 0 0 0 0 0 0 0 0 0      Past Medical History  Diagnosis Date  . Gestational diabetes   Sickle cell trait  Past Surgical History  Procedure Date  . Wisdom tooth extraction 2011   Family History: family history is not on file. Mother and paternal grandmother HTN;  PGM and PA diabetes.  Father drug use.  Social History:  reports that she has never smoked. She does not have any smokeless tobacco history on file. She reports that she does not drink alcohol or use illicit drugs.  FOB is involved and supportive.  Patient is college-educated, currently unemployed.  ROS:  + FM, no other symptoms    Temperature 98.5 F (36.9 C), last menstrual period 10/01/2010. Filed Vitals:   07/10/11 1108  Temp: 98.5 F (36.9 C)   Chest  clear Heart RRR without murmur Abd gravid, NT, pannus noted FHR reactive on Beacon monitor No contractions Pelvic--cervix long and closed, vtx -2 (vertex presentation verified per bedside US) Ext WNL  Prenatal labs: ABO, Rh: O/Positive/-- (02/13 0000) Antibody: Negative (02/13 0000) Rubella:  Immune RPR: Nonreactive (02/13 0000)  HBsAg: Negative (02/13 0000)  HIV: Non-reactive (02/13 0000)  GBS:   Positive Sickle cell trait positive Hgb WNL at NOB and at 28 weeks Hgb A1C 6.3 at 31 weeks. Cultures negative at 36 weeks  Assessment/Plan: IUP at 40 2/7 weeks Probable Type 2 diabetes--poor compliance with CBG monitoring Morbid obesity Positive GBS  Plan: Admit to Birthing Suite per consult with Dr. Su Hilt Routine [facility] labor orders Cytotech  25 mcg every 4 hours for 3-4 doses, then pitocin if cervix has improved. PCN G per standard dosing for GBS Rx. CBGs q 4 hours until active labor, then q 2 hours in active labor. Reviewed R&B of induction with patient, including failure of method, prolonged labor, need for C/S--patient seems to understand these risks and is agreeable with proceeding.  Pam [name] 07/10/2011, 11:59 AM

## 2011-07-10 NOTE — Progress Notes (Signed)
Pt states didn't go to appt at [facility] nutrition and Diabetes pt states will call to r/s Pt has not been checking CBGs she doesn't have a monitor

## 2011-07-11 ENCOUNTER — Encounter (HOSPITAL_COMMUNITY): Payer: Self-pay

## 2011-07-11 LAB — GLUCOSE, CAPILLARY
Glucose-Capillary: 75 mg/dL (ref 70–99)
Glucose-Capillary: 79 mg/dL (ref 70–99)

## 2011-07-11 MED ORDER — MISOPROSTOL 200 MCG PO TABS: 200.0000 ug | ORAL_TABLET | ORAL | Status: DC | PRN

## 2011-07-11 MED ORDER — MISOPROSTOL 25 MCG QUARTER TABLET
25.0000 ug | ORAL_TABLET | Freq: Once | ORAL | Status: AC
  Administered 2011-07-11: 25 ug via VAGINAL
  Filled 2011-07-11: qty 0.25

## 2011-07-11 MED ORDER — TERBUTALINE SULFATE 1 MG/ML IJ SOLN
0.2500 mg | Freq: Once | INTRAMUSCULAR | Status: AC | PRN
  Filled 2011-07-11: qty 1

## 2011-07-11 MED ORDER — MISOPROSTOL 25 MCG QUARTER TABLET
25.0000 ug | ORAL_TABLET | ORAL | Status: DC | PRN
  Administered 2011-07-11: 25 ug via VAGINAL
  Filled 2011-07-11: qty 1
  Filled 2011-07-11: qty 0.25

## 2011-07-11 MED ORDER — OXYTOCIN 20 UNITS IN LACTATED RINGERS INFUSION - SIMPLE
1.0000 m[IU]/min | INTRAVENOUS | Status: DC
  Administered 2011-07-12: 2 m[IU]/min via INTRAVENOUS
  Administered 2011-07-12: 11 m[IU]/min via INTRAVENOUS
  Filled 2011-07-11: qty 1000

## 2011-07-11 NOTE — Progress Notes (Signed)
Patient ID: TIARAH SHISLER, female   DOB: September 01, 1985, 26 y.o.   MRN: [id number] .Subjective: Doing well, was feeling some ctx, tolerated procedure well, getting ready to order dinner No c/o, denies HA/N/V/RUQ pain   Objective: BP 148/89  Pulse 84  Temp(Src) 98.6 F (37 C) (Oral)  Resp 18  Ht 5\' 8"  (1.727 m)  Wt 305 lb (138.347 kg)  BMI 46.38 kg/m2  LMP 10/01/2010   CBG (last 3)   Basename 07/11/11 2058 07/11/11 1709 07/11/11 1602  GLUCAP 65* 79 48*      FHT:  FHR: 130 bpm, variability: moderate,  accelerations:  Present,  decelerations:  Absent UC:   Occ, w UI SVE:   Dilation: 2.5 Effacement (%): 50 Station: -3 Exam by:: latham, CNM  Cook balloon catheter placed w/o difficulty, internal balloon inflated 80cc, external balloon 60cc  Assessment / Plan: IOL secondary to GDM/type2?  GBS pos, has been receiving  PCN Some progress w cytotec,  Cook balloon overnight Plan pitocin in the AM BP's mildly elevated, no PIH sx's, labs stable  24hour collection in progress will complete in the AM FHR reassuring, will do intermittent EFM overnight, NST q4h if remains reassuring   Fetal Wellbeing:  Category I Pain Control:  n/a  DR Stefano Gaul updated  Malissa Hippo 07/11/2011, 9:11 PM

## 2011-07-11 NOTE — Progress Notes (Signed)
  Subjective: Resting comfortably.  Occasional contractions.  Objective: BP 126/59  Pulse 83  Temp(Src) 98.6 F (37 C) (Oral)  Resp 20  Ht 5\' 8"  (1.727 m)  Wt 305 lb (138.347 kg)  BMI 46.38 kg/m2  LMP 10/01/2010   Total I/O In: 1380 [P.O.:480; I.V.:750; IV Piggyback:150] Out: 375 [Urine:375]  FHT: Category 1 UC:   Infrequent, mild SVE:   Dilation: 1 Effacement (%): 50 Station: -3 Exam by:: Jaelle Campanile,CNM Cervix slightly softer and vtx lower in pelvis.  Cytotech placed in the posterior fornix.  Filed Vitals:   07/11/11 1151  BP: 126/59  Pulse: 83  Temp: 98.6 F (37 C)  Resp: 20     Assessment / Plan: Continue cervical ripening. Reassess at 6:30pm or prn.   Pam [name] 07/11/2011, 2:23 PM

## 2011-07-11 NOTE — Progress Notes (Signed)
Pt feeling hypoglycemic.  CBG 48.  Pt given fruit and cheese tray and drinking juice.  Manfred Arch at bedise. Will recheck CBG in 1 hour

## 2011-07-11 NOTE — Progress Notes (Signed)
Pt feeling better

## 2011-07-11 NOTE — Progress Notes (Addendum)
  Subjective: Slept at intervals during night--occasionally aware of slight tightening, but no pain.  Denies HA, visual symptoms, or epigastric pain.  Objective: BP 142/97  Pulse 79  Temp(Src) 98.4 F (36.9 C) (Oral)  Resp 20  Ht 5\' 8"  (1.727 m)  Wt 305 lb (138.347 kg)  BMI 46.38 kg/m2  LMP 10/01/2010     Filed Vitals:   07/11/11 0133 07/11/11 0357 07/11/11 0654 07/11/11 0741  BP: 141/77 123/55 130/67 142/97  Pulse: 80 80 73 79  Temp: 97.9 F (36.6 C) 98.2 F (36.8 C) 98.4 F (36.9 C) 98.4 F (36.9 C)  TempSrc: Oral Oral Oral Oral  Resp: 20 18 18 20   Height:      Weight:         FHT: Category 1 UC:   irregular, every 6-3minutes, mild. SVE:  External os FT/1, thick, vtx -3--vertex verified by BS [place]   Labs: Lab Results  Component Value Date   WBC 9.3 07/10/2011   HGB 11.8* 07/10/2011   HCT 35.2* 07/10/2011   MCV 81.1 07/10/2011   PLT 250 07/10/2011    Assessment / Plan: Induction of labor due to diabetes Labile BP Unfavorable cervix GBS positive  Plan: Will place another cytotech. Allow to eat, shower, ambulate prior to placement. Observe labor status over next 4 hours to determine next step (pitocin vs. Foley vs. Repeat cytotech) Start 24 hour urine.  Chandi Nicklin 07/11/2011, 8:08 AM  Addendum: After patient's morning ablutions, I re-verified vtx presentation by BS [place]. Cytotech 25 mcg placed per vagina at 10:50am. FHR reactive, no decels,. UCs occasional.  CBGs stable.  Will re-evaluate cervix at 2pm for another Cytotech or other measure.  Nigel Bridgeman, CNM, [place]  07/11/11 11a

## 2011-07-12 ENCOUNTER — Encounter (HOSPITAL_COMMUNITY)
Admission: AD | Disposition: A | Discharge status: Home / Self Care | Payer: Self-pay | Source: Ambulatory Visit | Attending: Obstetrics and Gynecology

## 2011-07-12 ENCOUNTER — Encounter (HOSPITAL_COMMUNITY): Payer: Self-pay | Admitting: Anesthesiology

## 2011-07-12 ENCOUNTER — Encounter (HOSPITAL_COMMUNITY): Payer: Self-pay | Admitting: *Deleted

## 2011-07-12 ENCOUNTER — Inpatient Hospital Stay (HOSPITAL_COMMUNITY): Payer: Medicaid Other | Admitting: Anesthesiology

## 2011-07-12 DIAGNOSIS — IMO0002 Reserved for concepts with insufficient information to code with codable children: Secondary | ICD-10-CM | POA: Insufficient documentation

## 2011-07-12 DIAGNOSIS — O99820 Streptococcus B carrier state complicating pregnancy: Secondary | ICD-10-CM | POA: Insufficient documentation

## 2011-07-12 DIAGNOSIS — O368990 Maternal care for other specified fetal problems, unspecified trimester, not applicable or unspecified: Secondary | ICD-10-CM

## 2011-07-12 DIAGNOSIS — O480 Post-term pregnancy: Secondary | ICD-10-CM | POA: Insufficient documentation

## 2011-07-12 DIAGNOSIS — O43899 Other placental disorders, unspecified trimester: Secondary | ICD-10-CM

## 2011-07-12 DIAGNOSIS — O324XX0 Maternal care for high head at term, not applicable or unspecified: Secondary | ICD-10-CM

## 2011-07-12 LAB — PROTEIN, URINE, 24 HOUR: Protein, 24H Urine: 199 mg/d — ABNORMAL HIGH (ref 50–100)

## 2011-07-12 LAB — CREATININE CLEARANCE, URINE, 24 HOUR
Collection Interval-CRCL: 24 hours
Creatinine, 24H Ur: 2241 mg/d — ABNORMAL HIGH (ref 700–1800)
Creatinine, Urine: 56.38 mg/dL
Creatinine: 0.79 mg/dL (ref 0.50–1.10)
Urine Total Volume-CRCL: 3975 mL

## 2011-07-12 LAB — CBC
HCT: 35.6 % — ABNORMAL LOW (ref 36.0–46.0)
Hemoglobin: 12 g/dL (ref 12.0–15.0)
MCHC: 33.7 g/dL (ref 30.0–36.0)
RBC: 4.43 MIL/uL (ref 3.87–5.11)
WBC: 10.5 10*3/uL (ref 4.0–10.5)

## 2011-07-12 LAB — GLUCOSE, CAPILLARY
Glucose-Capillary: 84 mg/dL (ref 70–99)
Glucose-Capillary: 74 mg/dL (ref 70–99)
Glucose-Capillary: 100 mg/dL — ABNORMAL HIGH (ref 70–99)
Glucose-Capillary: 110 mg/dL — ABNORMAL HIGH (ref 70–99)

## 2011-07-12 SURGERY — Surgical Case
Anesthesia: Regional

## 2011-07-12 MED ORDER — MEPERIDINE HCL 25 MG/ML IJ SOLN
INTRAMUSCULAR | Status: AC
  Filled 2011-07-12: qty 1

## 2011-07-12 MED ORDER — CEFAZOLIN SODIUM 1-5 GM-% IV SOLN
INTRAVENOUS | Status: DC | PRN
  Administered 2011-07-12: 2 g via INTRAVENOUS

## 2011-07-12 MED ORDER — KETOROLAC TROMETHAMINE 30 MG/ML IJ SOLN
INTRAMUSCULAR | Status: AC
  Filled 2011-07-12: qty 1

## 2011-07-12 MED ORDER — PHENYLEPHRINE 40 MCG/ML (10ML) SYRINGE FOR IV PUSH (FOR BLOOD PRESSURE SUPPORT)
PREFILLED_SYRINGE | INTRAVENOUS | Status: AC
  Filled 2011-07-12: qty 5

## 2011-07-12 MED ORDER — KETOROLAC TROMETHAMINE 30 MG/ML IJ SOLN: 30.0000 mg | Freq: Four times a day (QID) | INTRAMUSCULAR | Status: DC | PRN

## 2011-07-12 MED ORDER — DIPHENHYDRAMINE HCL 50 MG/ML IJ SOLN: 12.5000 mg | INTRAMUSCULAR | Status: DC | PRN

## 2011-07-12 MED ORDER — MEPERIDINE HCL 25 MG/ML IJ SOLN: 6.2500 mg | INTRAMUSCULAR | Status: DC | PRN

## 2011-07-12 MED ORDER — EPHEDRINE 5 MG/ML INJ
INTRAVENOUS | Status: AC
  Filled 2011-07-12: qty 10

## 2011-07-12 MED ORDER — ACETAMINOPHEN 325 MG PO TABS: 325.0000 mg | ORAL_TABLET | ORAL | Status: DC | PRN

## 2011-07-12 MED ORDER — NALBUPHINE HCL 10 MG/ML IJ SOLN: 5.0000 mg | INTRAMUSCULAR | Status: DC | PRN

## 2011-07-12 MED ORDER — OXYTOCIN 20 UNITS IN LACTATED RINGERS INFUSION - SIMPLE
INTRAVENOUS | Status: DC | PRN
  Administered 2011-07-12 (×2): 20 [IU] via INTRAVENOUS

## 2011-07-12 MED ORDER — CEFAZOLIN SODIUM 1-5 GM-% IV SOLN
INTRAVENOUS | Status: AC
  Filled 2011-07-12: qty 100

## 2011-07-12 MED ORDER — EPHEDRINE 5 MG/ML INJ
10.0000 mg | INTRAVENOUS | Status: DC | PRN
  Administered 2011-07-12: 10 mg via INTRAVENOUS

## 2011-07-12 MED ORDER — BUPIVACAINE-EPINEPHRINE (PF) 0.5% -1:200000 IJ SOLN
INTRAMUSCULAR | Status: AC
  Filled 2011-07-12: qty 10

## 2011-07-12 MED ORDER — SCOPOLAMINE 1 MG/3DAYS TD PT72
1.0000 | MEDICATED_PATCH | Freq: Once | TRANSDERMAL | Status: DC
  Administered 2011-07-12: 1.5 mg via TRANSDERMAL

## 2011-07-12 MED ORDER — LIDOCAINE-EPINEPHRINE (PF) 2 %-1:200000 IJ SOLN
INTRAMUSCULAR | Status: DC | PRN
  Administered 2011-07-12: 4 mL via EPIDURAL

## 2011-07-12 MED ORDER — FENTANYL 2.5 MCG/ML BUPIVACAINE 1/10 % EPIDURAL INFUSION (WH - ANES)
INTRAMUSCULAR | Status: DC | PRN
  Administered 2011-07-12: 14 mL/h via EPIDURAL

## 2011-07-12 MED ORDER — MIDAZOLAM HCL 2 MG/2ML IJ SOLN: 0.5000 mg | Freq: Once | INTRAMUSCULAR | Status: AC | PRN

## 2011-07-12 MED ORDER — ONDANSETRON HCL 4 MG/2ML IJ SOLN
INTRAMUSCULAR | Status: AC
  Filled 2011-07-12: qty 2

## 2011-07-12 MED ORDER — OXYTOCIN 10 UNIT/ML IJ SOLN
INTRAMUSCULAR | Status: AC
  Filled 2011-07-12: qty 4

## 2011-07-12 MED ORDER — MORPHINE SULFATE (PF) 0.5 MG/ML IJ SOLN
INTRAMUSCULAR | Status: DC | PRN
  Administered 2011-07-12: 1 mg via INTRAVENOUS
  Administered 2011-07-12: 4 mg via EPIDURAL

## 2011-07-12 MED ORDER — CHLOROPROCAINE HCL 3 % IJ SOLN
INTRAMUSCULAR | Status: DC | PRN
  Administered 2011-07-12 (×2): 10 mL

## 2011-07-12 MED ORDER — FENTANYL CITRATE 0.05 MG/ML IJ SOLN: 25.0000 ug | INTRAMUSCULAR | Status: DC | PRN

## 2011-07-12 MED ORDER — LACTATED RINGERS IV SOLN: INTRAVENOUS | Status: DC

## 2011-07-12 MED ORDER — MEPERIDINE HCL 25 MG/ML IJ SOLN
6.2500 mg | INTRAMUSCULAR | Status: DC | PRN
  Administered 2011-07-12: 6.25 mg via INTRAVENOUS

## 2011-07-12 MED ORDER — MORPHINE SULFATE 0.5 MG/ML IJ SOLN
INTRAMUSCULAR | Status: AC
  Filled 2011-07-12: qty 10

## 2011-07-12 MED ORDER — SCOPOLAMINE 1 MG/3DAYS TD PT72
MEDICATED_PATCH | TRANSDERMAL | Status: AC
  Filled 2011-07-12: qty 1

## 2011-07-12 MED ORDER — PHENYLEPHRINE 40 MCG/ML (10ML) SYRINGE FOR IV PUSH (FOR BLOOD PRESSURE SUPPORT)
80.0000 ug | PREFILLED_SYRINGE | INTRAVENOUS | Status: DC | PRN
  Administered 2011-07-12: 09:00:00 via INTRAVENOUS
  Filled 2011-07-12: qty 5

## 2011-07-12 MED ORDER — PHENYLEPHRINE HCL 10 MG/ML IJ SOLN
INTRAMUSCULAR | Status: DC | PRN
  Administered 2011-07-12: 80 ug via INTRAVENOUS
  Administered 2011-07-12: 40 ug via INTRAVENOUS
  Administered 2011-07-12: 80 ug via INTRAVENOUS

## 2011-07-12 MED ORDER — CHLOROPROCAINE HCL 3 % IJ SOLN
INTRAMUSCULAR | Status: AC
  Filled 2011-07-12: qty 20

## 2011-07-12 MED ORDER — LACTATED RINGERS IV SOLN
INTRAVENOUS | Status: DC | PRN
  Administered 2011-07-12 (×2): via INTRAVENOUS

## 2011-07-12 MED ORDER — PHENYLEPHRINE 40 MCG/ML (10ML) SYRINGE FOR IV PUSH (FOR BLOOD PRESSURE SUPPORT): 80.0000 ug | PREFILLED_SYRINGE | INTRAVENOUS | Status: DC | PRN

## 2011-07-12 MED ORDER — KETOROLAC TROMETHAMINE 30 MG/ML IJ SOLN
30.0000 mg | Freq: Four times a day (QID) | INTRAMUSCULAR | Status: DC | PRN
  Administered 2011-07-12: 30 mg via INTRAVENOUS

## 2011-07-12 MED ORDER — PROMETHAZINE HCL 25 MG/ML IJ SOLN: 6.2500 mg | INTRAMUSCULAR | Status: DC | PRN

## 2011-07-12 MED ORDER — EPHEDRINE 5 MG/ML INJ
10.0000 mg | INTRAVENOUS | Status: DC | PRN
  Administered 2011-07-12: 10 mg via INTRAVENOUS
  Filled 2011-07-12: qty 4

## 2011-07-12 MED ORDER — EPHEDRINE SULFATE 50 MG/ML IJ SOLN
INTRAMUSCULAR | Status: DC | PRN
  Administered 2011-07-12: 20 mg via INTRAVENOUS

## 2011-07-12 MED ORDER — LACTATED RINGERS IV SOLN
INTRAVENOUS | Status: DC
  Administered 2011-07-12 (×2): via INTRAVENOUS

## 2011-07-12 MED ORDER — FENTANYL 2.5 MCG/ML BUPIVACAINE 1/10 % EPIDURAL INFUSION (WH - ANES)
14.0000 mL/h | INTRAMUSCULAR | Status: DC
  Administered 2011-07-12 (×2): 14 mL/h via EPIDURAL
  Filled 2011-07-12 (×4): qty 60

## 2011-07-12 MED ORDER — LACTATED RINGERS IV SOLN: 500.0000 mL | Freq: Once | INTRAVENOUS | Status: DC

## 2011-07-12 MED ORDER — ONDANSETRON HCL 4 MG/2ML IJ SOLN
INTRAMUSCULAR | Status: DC | PRN
  Administered 2011-07-12: 4 mg via INTRAVENOUS

## 2011-07-12 MED ORDER — TERBUTALINE SULFATE 1 MG/ML IJ SOLN
0.2500 mg | Freq: Once | INTRAMUSCULAR | Status: AC
  Administered 2011-07-12: 2.5 mg via SUBCUTANEOUS

## 2011-07-12 SURGICAL SUPPLY — 40 items
CHLORAPREP W/TINT 26ML (MISCELLANEOUS) IMPLANT
CLOTH BEACON ORANGE TIMEOUT ST (SAFETY) ×2 IMPLANT
CONTAINER PREFILL 10% NBF 15ML (MISCELLANEOUS) IMPLANT
DRAIN JACKSON PRT FLT 7MM (DRAIN) ×2 IMPLANT
DRESSING TELFA 8X3 (GAUZE/BANDAGES/DRESSINGS) ×2 IMPLANT
ELECT REM PT RETURN 9FT ADLT (ELECTROSURGICAL) ×2
ELECTRODE REM PT RTRN 9FT ADLT (ELECTROSURGICAL) ×1 IMPLANT
EVACUATOR SILICONE 100CC (DRAIN) ×2 IMPLANT
EXTRACTOR VACUUM M CUP 4 TUBE (SUCTIONS) IMPLANT
GAUZE SPONGE 4X4 12PLY STRL LF (GAUZE/BANDAGES/DRESSINGS) ×4 IMPLANT
GLOVE BIOGEL PI IND STRL 8.5 (GLOVE) ×1 IMPLANT
GLOVE BIOGEL PI INDICATOR 8.5 (GLOVE) ×1
GLOVE ECLIPSE 8.0 STRL XLNG CF (GLOVE) ×4 IMPLANT
GLOVE SS N UNI LF 7.5 STRL (GLOVE) ×2 IMPLANT
GLOVE SURG SS PI 7.5 STRL IVOR (GLOVE) ×2 IMPLANT
GOWN PREVENTION PLUS LG XLONG (DISPOSABLE) ×4 IMPLANT
GOWN PREVENTION PLUS XXLARGE (GOWN DISPOSABLE) ×2 IMPLANT
KIT ABG SYR 3ML LUER SLIP (SYRINGE) ×4 IMPLANT
NEEDLE HYPO 25X1 1.5 SAFETY (NEEDLE) ×2 IMPLANT
NEEDLE HYPO 25X5/8 SAFETYGLIDE (NEEDLE) ×4 IMPLANT
PACK C SECTION WH (CUSTOM PROCEDURE TRAY) ×2 IMPLANT
PAD ABD 7.5X8 STRL (GAUZE/BANDAGES/DRESSINGS) ×2 IMPLANT
RINGERS IRRIG 1000ML POUR BTL (IV SOLUTION) ×2 IMPLANT
SLEEVE SCD COMPRESS KNEE MED (MISCELLANEOUS) ×2 IMPLANT
STAPLER VISISTAT 35W (STAPLE) IMPLANT
SUT MNCRL AB 3-0 PS2 27 (SUTURE) IMPLANT
SUT PLAIN 0 NONE (SUTURE) IMPLANT
SUT SILK 3 0 FS 1X18 (SUTURE) IMPLANT
SUT VIC AB 0 CT1 27 (SUTURE) ×2
SUT VIC AB 0 CT1 27XBRD ANBCTR (SUTURE) ×2 IMPLANT
SUT VIC AB 2-0 CTX 36 (SUTURE) ×6 IMPLANT
SUT VIC AB 3-0 CT1 27 (SUTURE)
SUT VIC AB 3-0 CT1 TAPERPNT 27 (SUTURE) IMPLANT
SUT VIC AB 3-0 SH 27 (SUTURE)
SUT VIC AB 3-0 SH 27X BRD (SUTURE) IMPLANT
SYR CONTROL 10ML LL (SYRINGE) ×2 IMPLANT
TAPE CLOTH SURG 4X10 WHT LF (GAUZE/BANDAGES/DRESSINGS) ×2 IMPLANT
TOWEL OR 17X24 6PK STRL BLUE (TOWEL DISPOSABLE) ×4 IMPLANT
TRAY FOLEY CATH 14FR (SET/KITS/TRAYS/PACK) IMPLANT
WATER STERILE IRR 1000ML POUR (IV SOLUTION) ×2 IMPLANT

## 2011-07-12 NOTE — Progress Notes (Signed)
In OR and FSE not tracing FHR. External Plced

## 2011-07-12 NOTE — Anesthesia Procedure Notes (Signed)

## 2011-07-12 NOTE — Progress Notes (Signed)
The patient has not progressed her cervix. Her infant began having late decelerations. Her Pitocin was discontinued. Cesarean section was recommended. The risk and benefits were reviewed. Questions were answered. The risks include, but are not limited to, anesthetic complications, bleeding, infections, and possible damage to the surrounding organs.  Mylinda Latina.D.

## 2011-07-12 NOTE — Progress Notes (Signed)
  Subjective: Sleeping soundly after epidural.  Had approx 7 min decel just after epidural placed, with FHR down to 40s.   Scalp lead applied by RN, then I arrived and inserted an IUPC.  Stopped pitocin, gave SQ Terb.  Received 2 doses ephedrine, one dose phenylephrine for hypotensive episode.  Currently on right side.  Objective: BP 119/74  Pulse 107  Temp(Src) 98.7 F (37.1 C) (Oral)  Resp 18  Ht 5\' 8"  (1.727 m)  Wt 305 lb (138.347 kg)  BMI 46.38 kg/m2  SpO2 100%  LMP 10/01/2010 I/O last 3 completed shifts: In: 3505 [P.O.:1080; I.V.:2125; IV Piggyback:300] Out: 1425 [Urine:1425]   Filed Vitals:   07/12/11 0932 07/12/11 0936 07/12/11 1001 07/12/11 1031  BP:  116/43 121/46 128/47  Pulse: 119 107 102 102  Temp:  98.3 F (36.8 C)    TempSrc:  Oral    Resp:  20    Height:      Weight:      SpO2:        FHT:  Had sinusoidal-type pattern x 40 minutes, with moderate ST variability,  Currently now resolving to baseline 130-140, with moderate variability, no decels. UC:   irregular, every 2-7 minutes Cervix examined during decel--5, 60%, vtx, -3, not well-descended into pelvis. MVUs < 100.  Labs: Lab Results  Component Value Date   WBC 10.5 07/12/2011   HGB 12.0 07/12/2011   HCT 35.6* 07/12/2011   MCV 80.4 07/12/2011   PLT 232 07/12/2011   CBG 100.  Assessment / Plan: FHR decel, likely due to hypotensive episode No descent of vtx into pelvis Inadequate labor at present--pitocin off s/p decel.  Plan: Consulted with Dr. Stefano Gaul. Will restart pitocin and observe closely. Reviewed status and plan of care with patient and FOB--they seem to understand and agree with plan.  Nigel Bridgeman, CNM, [place] 07/12/11      Nigel Bridgeman 07/12/2011, 10:04 AM

## 2011-07-12 NOTE — Progress Notes (Signed)
Trying to position U/S to trace FHR continuously.  Possible decel, but unable to verify at present.

## 2011-07-12 NOTE — Preoperative (Signed)
Beta Blockers   Reason not to administer Beta Blockers:Not Applicable 

## 2011-07-12 NOTE — Progress Notes (Signed)
Patient ID: Pam [name], female   DOB: [birth year], 28 y.o.   MRN: [id number]  S: pt doing well, feels some stronger ctx, denies need for pain meds. Would like an epidural when appropriate  O: vss, FHR cat 1, irreg ctx VE 5cm/70/-2 per RN  A: early/active labor  P: begin pitocin at 3am per anesthesia request to wait 6hours after pt ate

## 2011-07-12 NOTE — Progress Notes (Signed)
  Subjective: Breathing with contractions.  Objective: BP 148/89  Pulse 94  Temp(Src) 98.7 F (37.1 C) (Oral)  Resp 18  Ht 5\' 8"  (1.727 m)  Wt 305 lb (138.347 kg)  BMI 46.38 kg/m2  LMP 10/01/2010 I/O last 3 completed shifts: In: 3505 [P.O.:1080; I.V.:2125; IV Piggyback:300] Out: 1425 [Urine:1425]   Filed Vitals:   07/12/11 0440 07/12/11 0531 07/12/11 0601 07/12/11 0712  BP: 155/101 142/84 160/97 148/89  Pulse: 84 80 83 94  Temp:   98.7 F (37.1 C)   TempSrc:   Oral   Resp: 18 18 18    Height:      Weight:       24 hour urine will complete at 7:40am. FHT:  Category 1 UC:   irregular, every 2-5 minutes Pitocin on 12 mu/min FHR and UCs difficult at times to trace due to body habitus  CBGs WNL  Assessment / Plan: Induction for Type 2 diabetes, day 3 S/p Cytotech, foley bulb--now on Pitocin since 3am Gestational hypertension--new onset.  Plan: Recommend placement of epidural, since that is patient's plan. After epidural, will plan AROM and placement of internal monitors. Await 24 hour urine.  Nigel Bridgeman, CNM, [place] 07/12/2011, 8:43 AM

## 2011-07-12 NOTE — Progress Notes (Signed)
  Subjective: Comfortable, slightly aware of pressure in right lower abdomen.  Turned from right side due to series of variables with contractions and late decels.   Objective: BP 145/78  Pulse 96  Temp(Src) 98.9 F (37.2 C) (Oral)  Resp 20  Ht 5\' 8"  (1.727 m)  Wt 305 lb (138.347 kg)  BMI 46.38 kg/m2  SpO2 100%  LMP 10/01/2010 I/O last 3 completed shifts: In: 3505 [P.O.:1080; I.V.:2125; IV Piggyback:300] Out: 2275 [Urine:2275]    FHT:  Series of late decels with contractions, moderate variability, accel with scalp stim, baseline 140s UC:   irregular, every 2-4 minutes, sporadic tripling pattern.  Pitocin on 12 mu/min--turned off with occurrence of decels. Had started amnioinfusion with initial decels.  FHR now stable after pitocin d/c'd--still has sporadic late decels, but moderate variability.  SVE:   5-6, 90%, vtx well-applied to cervix, but vtx still -2, some molding.  24 hour urine 197.  Filed Vitals:   07/12/11 1701 07/12/11 1731 07/12/11 1801 07/12/11 1831  BP: 137/80 132/74 139/76 145/78  Pulse: 92 92 98 96  Temp:      TempSrc:      Resp:      Height:      Weight:      SpO2:       CBG (last 3)   Basename 07/12/11 1623 07/12/11 1236 07/12/11 0913  GLUCAP 94 110* 100*     Labs: Lab Results  Component Value Date   WBC 10.5 07/12/2011   HGB 12.0 07/12/2011   HCT 35.6* 07/12/2011   MCV 80.4 07/12/2011   PLT 232 07/12/2011    Assessment / Plan: Non-reassuring FHR, induction for Type 2 diabetes FTP Positive GBS No evidence pre-eclampsia  Plan; Consulted with Dr. Conception Chancy proceed with C/S for non-reassuring pattern. R&B of C/S reviewed with patient and family, including bleeding, infection, damage to other organs--patient and family seem to understand these risks and wish to proceed.  Nigel Bridgeman, CNM, [place] 07/12/11 7:30pm    Caelan Atchley, [name] 07/12/2011, 7:27 PM

## 2011-07-12 NOTE — Anesthesia Postprocedure Evaluation (Signed)
Anesthesia Post Note  Patient: Pam [name]  Procedure(s) Performed: Procedure(s) (LRB): CESAREAN SECTION (N/A)  Anesthesia type: Epidural  Patient location: PACU  Post pain: Pain level controlled  Post assessment: Post-op Vital signs reviewed  Last Vitals:  Filed Vitals:   07/12/11 2109  BP: 97/37  Pulse: 86  Temp: 36.8 C  Resp: 10    Post vital signs: Reviewed  Level of consciousness: awake  Complications: No apparent anesthesia complications

## 2011-07-12 NOTE — Progress Notes (Signed)
Pt. Transferred to stretcher. IUPC removed without difficulty.

## 2011-07-12 NOTE — Op Note (Addendum)
OPERATIVE NOTE  Patient's Name: Pam [name]  Date of Birth: May 05, 1985  Medical Records Number: [id number]  Date of Operation: 07/12/2011  Preoperative diagnosis:  [redacted]w[redacted]d weeks gestation  Non-reassuring Fetal Heart Rate  Morbid obesity  Probable type 2 diabetes  Postoperative diagnosis:  [redacted]w[redacted]d weeks gestation  Non-reassuring Fetal Heart Rate  Morbid obesity  Probable type 2 diabetes  Procedure:  Primary low transverse cesarean section  Surgeon:  Leonard Schwartz, M.D.  Assistant:   Sanda Klein, certified nurse midwife  Anesthesia:  Regional  Disposition:  Pam [name] is a 26 y.o. female, G1P1001, who presents at [redacted]w[redacted]d weeks gestation. The patient has been followed at the [facility] obstetrics and gynecology division of [facility] health care for women. She has the above mentioned diagnosis.  Induction was attempted. The patient dilated her cervix to 5 cm. Late decelerations developed in spite of resuscitation techniques. Her Pitocin was discontinued as we prepared for cesarean delivery. Once in the operating room we again noted decelerations in the fetal heart rate. The patient's IV infiltrated and had to be restarted. She understands the indications for her procedure and she accepts the risk of, but not limited to, anesthetic complications, bleeding, infections, and possible damage to the surrounding organs.  Findings:  A 7 pound  14 ounce  female [facility]) was delivered from a vertex position.  The Apgar scores were  3 /8 . The uterus, fallopian tubes, and ovaries were normal for the gravid state. The arterial cord blood pH was 7.02. The venous cord blood pH was 7.00.   Procedure:  The patient was taken to the operating room where the epidural she had received her labor was thought to be adequate for cesarean delivery. The IV was noted to be infiltrated and another IV had to be quickly started.  The patient's abdomen was prepped with Betadine. A  Foley catheter was placed in the bladder on labor and delivery. The patient was sterilely draped. A low transverse incision was made in the abdomen and carried sharply through the subcutaneous tissue, the fascia, and the anterior peritoneum. An incision was made in the lower uterine segment. The incision was extended in a low transverse fashion. The fetal head was delivered without difficulty. The mouth and nose were suctioned. The remainder of the infant was then delivered. The cord was clamped and cut. The infant was handed to the awaiting pediatric team.  Arterial and venous pH samples were obtained. The placenta was removed. The uterine cavity was cleaned of amniotic fluid, clotted blood, and membranes. The uterine incision was closed using a running locking suture of 2-0 Vicryl. An imbricating suture of 2-0 Vicryl was placed. The pelvis was vigorously irrigated. Hemostasis was adequate. The anterior peritoneum and the abdominal musculature were closed using 2-0 Vicryl. The fascia was closed using a running suture of 0 Vicryl followed by 3 interrupted sutures of 0 Vicryl. A Jackson-Pratt drain was placed in the subcutaneous layer and brought out through the lateral quadrant. It was sutured into place using 3-0 silk.  The subcutaneous layer was closed using interrupted sutures of 0 plain catgut. The skin was reapproximated using a subcuticular suture of 3-0 Monocryl. Sponge, needle, and instrument counts were correct on 2 occasions. The estimated blood loss for the procedure was  700 cc. The patient tolerated her procedure well. She was transported to the recovery room in stable condition. The infant was taken to the full-term nursery in stable condition. The placenta was sent to pathology.  Leonard Schwartz, M.D.

## 2011-07-12 NOTE — Progress Notes (Signed)
Reviewed strip c Nigel Bridgeman, CNM.  Indeterminate BL running between 120-150.  Characteristics of sinusoidal pattern, but possibly marked variability.  No vaginal bleeding, uterine resting tone soft, + Rh.  Recently rec'd 2 doses of ephedrine, 1 of phenylephrine & terbutaline.  Will continue to monitor closely.  [name] will remain at station.

## 2011-07-12 NOTE — Progress Notes (Signed)
  Subjective: Comfortable with epidural.  Objective: BP 127/62  Pulse 90  Temp(Src) 98.7 F (37.1 C) (Oral)  Resp 20  Ht 5\' 8"  (1.727 m)  Wt 305 lb (138.347 kg)  BMI 46.38 kg/m2  SpO2 100%  LMP 10/01/2010 I/O last 3 completed shifts: In: 3505 [P.O.:1080; I.V.:2125; IV Piggyback:300] Out: 1425 [Urine:1425]    FHT: Sporadic mild variables, moderate variability. UC:   Irregular in timing and quality, every 2-5 minutes. MVUs 110 Pitocin on 7 mu/min.   Assessment / Plan: Induction of labor, inadequate UCs Type 2 diabetic--stable CBGs  Plan; Continue augmentation to achieve adequacy.  Nigel Bridgeman 07/12/2011, 4:21 PM

## 2011-07-12 NOTE — Progress Notes (Signed)
24hr urine collected & sent

## 2011-07-12 NOTE — Transfer of Care (Signed)
Immediate Anesthesia Transfer of Care Note  Patient: Pam [name]  Procedure(s) Performed: Procedure(s) (LRB): CESAREAN SECTION (N/A)  Patient Location: PACU  Anesthesia Type: Epidural  Level of Consciousness: awake, alert  and oriented  Airway & Oxygen Therapy: Patient Spontanous Breathing  Post-op Assessment: Report given to PACU RN and Post -op Vital signs reviewed and stable  Post vital signs: stable  Complications: No apparent anesthesia complications

## 2011-07-12 NOTE — Progress Notes (Signed)
  Subjective: Sleeping at intervals.  No pain.  Mother, sister, FOB at bedside.  Objective: BP 159/74  Pulse 102  Temp(Src) 98.5 F (36.9 C) (Oral)  Resp 20  Ht 5\' 8"  (1.727 m)  Wt 305 lb (138.347 kg)  BMI 46.38 kg/m2  SpO2 100%  LMP 10/01/2010 I/O last 3 completed shifts: In: 3505 [P.O.:1080; I.V.:2125; IV Piggyback:300] Out: 1425 [Urine:1425]   Filed Vitals:   07/12/11 1231 07/12/11 1247 07/12/11 1249 07/12/11 1301  BP: 142/70 168/90 152/67 159/74  Pulse: 101 153 101 102  Temp:    98.5 F (36.9 C)  TempSrc:    Oral  Resp:    20  Height:      Weight:      SpO2:       CBG (last 3)   Basename 07/12/11 0913 07/12/11 0653 07/12/11 0059  GLUCAP 100* 74 84   Most recent = 120   FHT: Category 1 UC:   Irregular, occ coupling. Leaking clear fluid. Pitocin on 5 mu/min Occasional mild variable.   Assessment / Plan: Induction of labor--on pitocin Type 2 diabetes  Continue augmentation at present. Continue close observation of FHR status.   Nigel Bridgeman 07/12/2011, 2:04 PM

## 2011-07-12 NOTE — Anesthesia Preprocedure Evaluation (Signed)
Anesthesia Evaluation  Patient identified by MRN, date of birth, ID band Patient awake    Reviewed: Allergy & Precautions, H&P , Patient's Chart, lab work & pertinent test results  Airway Mallampati: III TM Distance: >3 FB Neck ROM: full    Dental  (+) Teeth Intact   Pulmonary  breath sounds clear to auscultation        Cardiovascular Rhythm:regular Rate:Normal     Neuro/Psych    GI/Hepatic   Endo/Other  Diabetes mellitus-, GestationalMorbid obesity  Renal/GU      Musculoskeletal   Abdominal   Peds  Hematology   Anesthesia Other Findings       Reproductive/Obstetrics (+) Pregnancy                           Anesthesia Physical Anesthesia Plan  ASA: III  Anesthesia Plan: Epidural   Post-op Pain Management:    Induction:   Airway Management Planned:   Additional Equipment:   Intra-op Plan:   Post-operative Plan:   Informed Consent: I have reviewed the patients History and Physical, chart, labs and discussed the procedure including the risks, benefits and alternatives for the proposed anesthesia with the patient or authorized representative who has indicated his/her understanding and acceptance.   Dental Advisory Given  Plan Discussed with:   Anesthesia Plan Comments: (Labs checked- platelets confirmed with RN in room. Fetal heart tracing, per RN, reported to be stable enough for sitting procedure. Discussed epidural, and patient consents to the procedure:  included risk of possible headache,backache, failed block, allergic reaction, and nerve injury. This patient was asked if she had any questions or concerns before the procedure started. )        Anesthesia Quick Evaluation  

## 2011-07-13 DIAGNOSIS — Z98891 History of uterine scar from previous surgery: Secondary | ICD-10-CM

## 2011-07-13 LAB — CBC
Hemoglobin: 9.9 g/dL — ABNORMAL LOW (ref 12.0–15.0)
MCV: 81.5 fL (ref 78.0–100.0)
Platelets: 215 10*3/uL (ref 150–400)
RBC: 3.68 MIL/uL — ABNORMAL LOW (ref 3.87–5.11)
WBC: 16 10*3/uL — ABNORMAL HIGH (ref 4.0–10.5)

## 2011-07-13 MED ORDER — DIPHENHYDRAMINE HCL 25 MG PO CAPS: 25.0000 mg | ORAL_CAPSULE | ORAL | Status: DC | PRN

## 2011-07-13 MED ORDER — IBUPROFEN 600 MG PO TABS
600.0000 mg | ORAL_TABLET | Freq: Four times a day (QID) | ORAL | Status: DC
  Administered 2011-07-13 – 2011-07-15 (×10): 600 mg via ORAL
  Filled 2011-07-13 (×11): qty 1

## 2011-07-13 MED ORDER — MEDROXYPROGESTERONE ACETATE 150 MG/ML IM SUSP: 150.0000 mg | INTRAMUSCULAR | Status: DC | PRN

## 2011-07-13 MED ORDER — SIMETHICONE 80 MG PO CHEW: 80.0000 mg | CHEWABLE_TABLET | ORAL | Status: DC | PRN

## 2011-07-13 MED ORDER — ZOLPIDEM TARTRATE 5 MG PO TABS: 5.0000 mg | ORAL_TABLET | Freq: Every evening | ORAL | Status: DC | PRN

## 2011-07-13 MED ORDER — PRENATAL MULTIVITAMIN CH
1.0000 | ORAL_TABLET | Freq: Every day | ORAL | Status: DC
  Administered 2011-07-13 – 2011-07-15 (×3): 1 via ORAL
  Filled 2011-07-13 (×3): qty 1

## 2011-07-13 MED ORDER — ONDANSETRON HCL 4 MG/2ML IJ SOLN: 4.0000 mg | Freq: Three times a day (TID) | INTRAMUSCULAR | Status: DC | PRN

## 2011-07-13 MED ORDER — CEFAZOLIN SODIUM-DEXTROSE 2-3 GM-% IV SOLR: 2.0000 g | INTRAVENOUS | Status: DC

## 2011-07-13 MED ORDER — ONDANSETRON HCL 4 MG PO TABS: 4.0000 mg | ORAL_TABLET | ORAL | Status: DC | PRN

## 2011-07-13 MED ORDER — GLYBURIDE 5 MG PO TABS
5.0000 mg | ORAL_TABLET | Freq: Every day | ORAL | Status: DC
  Administered 2011-07-13 – 2011-07-14 (×2): 5 mg via ORAL
  Filled 2011-07-13 (×3): qty 1

## 2011-07-13 MED ORDER — LACTATED RINGERS IV SOLN
INTRAVENOUS | Status: DC
  Administered 2011-07-13 (×2): via INTRAVENOUS

## 2011-07-13 MED ORDER — OXYTOCIN 20 UNITS IN LACTATED RINGERS INFUSION - SIMPLE: 125.0000 mL/h | INTRAVENOUS | Status: AC

## 2011-07-13 MED ORDER — ACETAMINOPHEN 10 MG/ML IV SOLN
1000.0000 mg | Freq: Four times a day (QID) | INTRAVENOUS | Status: AC | PRN
  Filled 2011-07-13: qty 100

## 2011-07-13 MED ORDER — METOCLOPRAMIDE HCL 5 MG/ML IJ SOLN: 10.0000 mg | Freq: Three times a day (TID) | INTRAMUSCULAR | Status: DC | PRN

## 2011-07-13 MED ORDER — SODIUM CHLORIDE 0.9 % IJ SOLN: 3.0000 mL | INTRAMUSCULAR | Status: DC | PRN

## 2011-07-13 MED ORDER — DIPHENHYDRAMINE HCL 50 MG/ML IJ SOLN: 25.0000 mg | INTRAMUSCULAR | Status: DC | PRN

## 2011-07-13 MED ORDER — DIBUCAINE 1 % RE OINT: 1.0000 "application " | TOPICAL_OINTMENT | RECTAL | Status: DC | PRN

## 2011-07-13 MED ORDER — LANOLIN HYDROUS EX OINT: 1.0000 "application " | TOPICAL_OINTMENT | CUTANEOUS | Status: DC | PRN

## 2011-07-13 MED ORDER — TETANUS-DIPHTH-ACELL PERTUSSIS 5-2.5-18.5 LF-MCG/0.5 IM SUSP
0.5000 mL | Freq: Once | INTRAMUSCULAR | Status: AC
  Administered 2011-07-13: 0.5 mL via INTRAMUSCULAR
  Filled 2011-07-13: qty 0.5

## 2011-07-13 MED ORDER — SODIUM CHLORIDE 0.9 % IV SOLN
1.0000 ug/kg/h | INTRAVENOUS | Status: DC | PRN
  Filled 2011-07-13: qty 2.5

## 2011-07-13 MED ORDER — WITCH HAZEL-GLYCERIN EX PADS: 1.0000 "application " | MEDICATED_PAD | CUTANEOUS | Status: DC | PRN

## 2011-07-13 MED ORDER — MENTHOL 3 MG MT LOZG: 1.0000 | LOZENGE | OROMUCOSAL | Status: DC | PRN

## 2011-07-13 MED ORDER — ONDANSETRON HCL 4 MG/2ML IJ SOLN: 4.0000 mg | INTRAMUSCULAR | Status: DC | PRN

## 2011-07-13 MED ORDER — SIMETHICONE 80 MG PO CHEW
80.0000 mg | CHEWABLE_TABLET | Freq: Three times a day (TID) | ORAL | Status: DC
  Administered 2011-07-13 – 2011-07-15 (×9): 80 mg via ORAL

## 2011-07-13 MED ORDER — DIPHENHYDRAMINE HCL 25 MG PO CAPS: 25.0000 mg | ORAL_CAPSULE | Freq: Four times a day (QID) | ORAL | Status: DC | PRN

## 2011-07-13 MED ORDER — DIPHENHYDRAMINE HCL 25 MG PO CAPS
25.0000 mg | ORAL_CAPSULE | Freq: Every evening | ORAL | Status: DC | PRN
  Filled 2011-07-13: qty 1

## 2011-07-13 MED ORDER — NALOXONE HCL 0.4 MG/ML IJ SOLN: 0.4000 mg | INTRAMUSCULAR | Status: DC | PRN

## 2011-07-13 MED ORDER — SENNOSIDES-DOCUSATE SODIUM 8.6-50 MG PO TABS
2.0000 | ORAL_TABLET | Freq: Every day | ORAL | Status: DC
  Administered 2011-07-13: 2 via ORAL

## 2011-07-13 MED ORDER — DIPHENHYDRAMINE HCL 50 MG/ML IJ SOLN: 12.5000 mg | INTRAMUSCULAR | Status: DC | PRN

## 2011-07-13 MED ORDER — OXYCODONE-ACETAMINOPHEN 5-325 MG PO TABS: 1.0000 | ORAL_TABLET | ORAL | Status: DC | PRN

## 2011-07-13 MED ORDER — MEASLES, MUMPS & RUBELLA VAC ~~LOC~~ INJ
0.5000 mL | INJECTION | Freq: Once | SUBCUTANEOUS | Status: DC
  Filled 2011-07-13: qty 0.5

## 2011-07-13 NOTE — Addendum Note (Signed)
Addendum  created 07/13/11 0827 by Jhonnie Garner, CRNA   Modules edited:Notes Section

## 2011-07-13 NOTE — Progress Notes (Addendum)
Subjective: Postpartum Day 1: Cesarean Delivery Patient reports tolerating PO.    Objective: Vital signs in last 24 hours: Temp:  [97.9 F (36.6 C)-99.1 F (37.3 C)] 99.1 F (37.3 C) (06/02 0830) Pulse Rate:  [70-153] 85  (06/02 0830) Resp:  [10-26] 19  (06/02 0830) BP: (97-168)/(37-99) 112/76 mmHg (06/02 0830) SpO2:  [95 %-100 %] 97 % (06/02 0830)  Physical Exam:  General: alert, cooperative and no distress Lochia: appropriate Uterine Fundus: firm Incision: Dressing clean and dry DVT Evaluation: No evidence of DVT seen on physical exam. Minimal drainage from JP drain.   Basename 07/13/11 0550 07/12/11 0730  HGB 9.9* 12.0  HCT 30.0* 35.6*   Fasting blood sugar 138  Assessment/Plan: Postoperative anemia. Status post Cesarean section. Doing well postoperatively.  Continue current care. Continue glyburide. Contraception discussed. Undecided at the moment which method she wants to try.  Aylen Rambert V 07/13/2011, 9:30 AM

## 2011-07-13 NOTE — Anesthesia Postprocedure Evaluation (Signed)
Anesthesia Post Note  Patient: Pam [name]  Procedure(s) Performed: Procedure(s) (LRB): CESAREAN SECTION (N/A)  Anesthesia type: Epidural  Patient location: Mother/Baby  Post pain: Pain level controlled  Post assessment: Post-op Vital signs reviewed  Last Vitals:  Filed Vitals:   07/13/11 0625  BP: 121/66  Pulse: 92  Temp: 36.9 C  Resp: 18    Post vital signs: Reviewed  Level of consciousness: awake  Complications: No apparent anesthesia complications

## 2011-07-14 ENCOUNTER — Encounter (HOSPITAL_COMMUNITY): Payer: Self-pay | Admitting: Obstetrics and Gynecology

## 2011-07-14 LAB — GLUCOSE, CAPILLARY
Glucose-Capillary: 96 mg/dL (ref 70–99)
Glucose-Capillary: 82 mg/dL (ref 70–99)

## 2011-07-14 MED ORDER — POLYSACCHARIDE IRON COMPLEX 150 MG PO CAPS
150.0000 mg | ORAL_CAPSULE | Freq: Every day | ORAL | Status: DC
  Administered 2011-07-15: 150 mg via ORAL
  Filled 2011-07-14: qty 1

## 2011-07-14 NOTE — Progress Notes (Signed)
UR chart review completed.  

## 2011-07-14 NOTE — Progress Notes (Signed)
Subjective: Postpartum Day 2: Cesarean Delivery Patient reports + flatus, + BM and no problems voiding.  No dizziness.  S.o. At bedside.  Bf'ng going well.  Hasn't showered yet.  Describes incisional pain as feeling more like "pressure."  VB light.  Newborn asleep at pt's Rt breast.  Objective: Vital signs in last 24 hours: Temp:  [98.1 F (36.7 C)-98.9 F (37.2 C)] 98.1 F (36.7 C) (06/03 0635) Pulse Rate:  [83-96] 83  (06/03 0635) Resp:  [18] 18  (06/03 0635) BP: (114-126)/(61-82) 122/82 mmHg (06/03 0635) SpO2:  [94 %-98 %] 94 % (06/02 2033)  Physical Exam:  General: alert, cooperative, no distress and morbidly obese Lochia: appropriate Uterine Fundus: firm, below umbilicus Incision: no significant drainage, post-op dsg still intact; old drainage on pt's Left side; JP to SD--serosang DVT Evaluation: No evidence of DVT seen on physical exam. Negative Homan's sign.   Basename 07/13/11 0550 07/12/11 0730  HGB 9.9* 12.0  HCT 30.0* 35.6*  .Marland Kitchen Results for orders placed during the hospital encounter of 07/10/11 (from the past 48 hour(s))  GLUCOSE, CAPILLARY     Status: Abnormal   Collection Time   07/12/11 12:36 PM      Component Value Range Comment   Glucose-Capillary 110 (*) 70 - 99 (mg/dL)   GLUCOSE, CAPILLARY     Status: Normal   Collection Time   07/12/11  4:23 PM      Component Value Range Comment   Glucose-Capillary 94  70 - 99 (mg/dL)   GLUCOSE, CAPILLARY     Status: Abnormal   Collection Time   07/12/11  9:59 PM      Component Value Range Comment   Glucose-Capillary 119 (*) 70 - 99 (mg/dL)    Comment 1 Documented in Chart     GLUCOSE, CAPILLARY     Status: Abnormal   Collection Time   07/13/11  4:55 AM      Component Value Range Comment   Glucose-Capillary 138 (*) 70 - 99 (mg/dL)   CBC     Status: Abnormal   Collection Time   07/13/11  5:50 AM      Component Value Range Comment   WBC 16.0 (*) 4.0 - 10.5 (K/uL)    RBC 3.68 (*) 3.87 - 5.11 (MIL/uL)    Hemoglobin 9.9 (*)  12.0 - 15.0 (g/dL)    HCT 16.1 (*) 09.6 - 46.0 (%)    MCV 81.5  78.0 - 100.0 (fL)    MCH 26.9  26.0 - 34.0 (pg)    MCHC 33.0  30.0 - 36.0 (g/dL)    RDW 04.5  40.9 - 81.1 (%)    Platelets 215  150 - 400 (K/uL)   GLUCOSE, CAPILLARY     Status: Abnormal   Collection Time   07/14/11  6:01 AM      Component Value Range Comment   Glucose-Capillary 62 (*) 70 - 99 (mg/dL)   GLUCOSE, CAPILLARY     Status: Normal   Collection Time   07/14/11  6:44 AM      Component Value Range Comment   Glucose-Capillary 96  70 - 99 (mg/dL)     Assessment/Plan: Status post primary Cesarean section. GDM vs Type II DM--Dr. Stefano Gaul has continued pt's Glyburide PP.  Fasting cbg=68. Will check 2hr PP today.    Continue current care. Mild anemia--on po iron supplement. Pam [name] H 07/14/2011, 11:22 AM

## 2011-07-15 LAB — GLUCOSE, CAPILLARY: Glucose-Capillary: 94 mg/dL (ref 70–99)

## 2011-07-15 MED ORDER — OXYCODONE-ACETAMINOPHEN 5-325 MG PO TABS: 1.0000 | ORAL_TABLET | ORAL | Status: AC | PRN

## 2011-07-15 MED ORDER — IBUPROFEN 600 MG PO TABS: 600.0000 mg | ORAL_TABLET | Freq: Four times a day (QID) | ORAL | Status: AC | PRN

## 2011-07-15 NOTE — Progress Notes (Deleted)
Fasting CBG=62. Apple juice 4 oz, one package graham crackers, and one peanut butter given.

## 2011-07-15 NOTE — Progress Notes (Signed)
Fasting CBG=62. Apple juice 4 oz, one package graham crackers, one peanut butter given.

## 2011-07-15 NOTE — Discharge Instructions (Signed)

## 2011-07-15 NOTE — Progress Notes (Deleted)
Please order Tdap if desired prior to discharge.

## 2011-07-15 NOTE — Discharge Summary (Signed)
Obstetric Discharge Summary Reason for Admission: induction of labor for probable Type 2 diabetes Prenatal Procedures: NST, ultrasound Intrapartum Procedures: cesarean: low cervical, transverse for non-reassuring FHR and FTP Postpartum Procedures: none Complications-Operative and Postpartum: none Hemoglobin  Date Value Range Status  07/13/2011 9.9* 12.0-15.0 (g/dL) Final     DELTA CHECK NOTED     REPEATED TO VERIFY     HCT  Date Value Range Status  07/13/2011 30.0* 36.0-46.0 (%) Final   Hgb pre-delivery 11.8.  Hospital Course: Admitted for induction on 07/10/11.  Positive GBS.   Placed on Cytotech q 4 hours, then started on Pitocin.  Progressed to 5 cm, but began to have decelerations, with inability of fetus to tolerate contractions. Delivery was performed by Dr. Stefano Gaul without complication. Patient and baby tolerated the procedure without difficulty. Infant to FTN.  Mother and infant then had an uncomplicated postpartum course, with breast feeding going well. Mom's physical exam was WNL, and she was discharged home in stable condition. Contraception plan was undecided at the time of discharge.  She received adequate benefit from po pain medications and was using both Motrin and Percocet.  JP drain was removed without difficulty.      Physical Exam:  General: alert Lochia: appropriate Uterine Fundus: firm Incision: healing well DVT Evaluation: No evidence of DVT seen on physical exam. Negative Homan's sign.  Discharge Diagnoses: Term Pregnancy-delivered, Type 2 diabetes, Primary LTCS for non-reassuring FHR, FTP  Discharge Information: Date: 07/15/2011 Activity: Per [facility] handout Diet: routine Medications: Ibuprofen and Percocet Condition: stable Instructions: refer to practice specific booklet Discharge to: home Contraception:  Undecided at present Follow-up Information    Follow up with [facility] in 6 weeks. (Call for appointment, or as needed)          Newborn Data: Live  born female  Birth Weight: 7 lb 14.6 oz (3590 g) APGAR: 3, 8  Home with mother.  Pam [name] 07/15/2011, 11:03 AM

## 2011-07-15 NOTE — Progress Notes (Signed)
Follow up CBG= 86

## 2011-08-21 ENCOUNTER — Ambulatory Visit: Payer: Medicaid Other | Admitting: Obstetrics and Gynecology

## 2013-02-04 ENCOUNTER — Encounter (HOSPITAL_COMMUNITY): Payer: Self-pay

## 2013-02-04 ENCOUNTER — Inpatient Hospital Stay (HOSPITAL_COMMUNITY)
Admission: AD | Admit: 2013-02-04 | Discharge: 2013-02-04 | Disposition: A | Discharge status: Home / Self Care | Payer: Medicaid Other | Source: Ambulatory Visit | Attending: Obstetrics and Gynecology | Admitting: Obstetrics and Gynecology

## 2013-02-04 DIAGNOSIS — O34219 Maternal care for unspecified type scar from previous cesarean delivery: Secondary | ICD-10-CM | POA: Insufficient documentation

## 2013-02-04 DIAGNOSIS — O0930 Supervision of pregnancy with insufficient antenatal care, unspecified trimester: Secondary | ICD-10-CM | POA: Insufficient documentation

## 2013-02-04 DIAGNOSIS — M549 Dorsalgia, unspecified: Secondary | ICD-10-CM | POA: Insufficient documentation

## 2013-02-04 DIAGNOSIS — O99891 Other specified diseases and conditions complicating pregnancy: Secondary | ICD-10-CM | POA: Insufficient documentation

## 2013-02-04 LAB — URINALYSIS, ROUTINE W REFLEX MICROSCOPIC
Glucose, UA: NEGATIVE mg/dL
Ketones, ur: NEGATIVE mg/dL
Nitrite: NEGATIVE
Protein, ur: NEGATIVE mg/dL
Urobilinogen, UA: 0.2 mg/dL (ref 0.0–1.0)

## 2013-02-04 NOTE — MAU Note (Signed)
Patient states she had prenatal care until 20 weeks at [facility] until she was dismissed from the practice due to Medicaid. States she has had no prenatal care since that time. States she has some upper back pain three times over the past month. Denies bleeding, leaking or contractions. Reports good fetal movement. Has a history of previous cesarean due to fetal stress.

## 2013-02-14 ENCOUNTER — Encounter (HOSPITAL_COMMUNITY): Payer: Medicaid Other | Admitting: Anesthesiology

## 2013-02-14 ENCOUNTER — Inpatient Hospital Stay (HOSPITAL_COMMUNITY): Payer: Medicaid Other | Admitting: Anesthesiology

## 2013-02-14 ENCOUNTER — Encounter (HOSPITAL_COMMUNITY): Payer: Self-pay | Admitting: *Deleted

## 2013-02-14 ENCOUNTER — Inpatient Hospital Stay (HOSPITAL_COMMUNITY)
Admission: AD | Admit: 2013-02-14 | Discharge: 2013-02-17 | DRG: 766 | Disposition: A | Discharge status: Home / Self Care | Payer: Medicaid Other | Source: Ambulatory Visit | Attending: Obstetrics & Gynecology | Admitting: Obstetrics & Gynecology

## 2013-02-14 ENCOUNTER — Encounter (HOSPITAL_COMMUNITY)
Admission: AD | Disposition: A | Discharge status: Home / Self Care | Payer: Self-pay | Source: Ambulatory Visit | Attending: Obstetrics & Gynecology

## 2013-02-14 DIAGNOSIS — O9989 Other specified diseases and conditions complicating pregnancy, childbirth and the puerperium: Secondary | ICD-10-CM

## 2013-02-14 DIAGNOSIS — IMO0002 Reserved for concepts with insufficient information to code with codable children: Principal | ICD-10-CM | POA: Diagnosis present

## 2013-02-14 DIAGNOSIS — Z9889 Other specified postprocedural states: Secondary | ICD-10-CM

## 2013-02-14 DIAGNOSIS — O34219 Maternal care for unspecified type scar from previous cesarean delivery: Secondary | ICD-10-CM | POA: Diagnosis present

## 2013-02-14 DIAGNOSIS — Z98891 History of uterine scar from previous surgery: Secondary | ICD-10-CM

## 2013-02-14 DIAGNOSIS — Z22330 Carrier of Group B streptococcus: Secondary | ICD-10-CM

## 2013-02-14 DIAGNOSIS — O99892 Other specified diseases and conditions complicating childbirth: Secondary | ICD-10-CM | POA: Diagnosis present

## 2013-02-14 DIAGNOSIS — O0930 Supervision of pregnancy with insufficient antenatal care, unspecified trimester: Secondary | ICD-10-CM

## 2013-02-14 DIAGNOSIS — O99814 Abnormal glucose complicating childbirth: Secondary | ICD-10-CM | POA: Diagnosis present

## 2013-02-14 DIAGNOSIS — O3660X0 Maternal care for excessive fetal growth, unspecified trimester, not applicable or unspecified: Secondary | ICD-10-CM | POA: Diagnosis present

## 2013-02-14 DIAGNOSIS — Z302 Encounter for sterilization: Secondary | ICD-10-CM

## 2013-02-14 HISTORY — DX: Essential (primary) hypertension: I10

## 2013-02-14 LAB — TYPE AND SCREEN
ABO/RH(D): O POS
ANTIBODY SCREEN: NEGATIVE

## 2013-02-14 LAB — DIFFERENTIAL
Basophils Absolute: 0 10*3/uL (ref 0.0–0.1)
Basophils Relative: 0 % (ref 0–1)
EOS ABS: 0.1 10*3/uL (ref 0.0–0.7)
Eosinophils Relative: 1 % (ref 0–5)
LYMPHS ABS: 1.5 10*3/uL (ref 0.7–4.0)
Lymphocytes Relative: 27 % (ref 12–46)
Monocytes Absolute: 0.7 10*3/uL (ref 0.1–1.0)
Monocytes Relative: 12 % (ref 3–12)
NEUTROS PCT: 59 % (ref 43–77)
Neutro Abs: 3.2 10*3/uL (ref 1.7–7.7)

## 2013-02-14 LAB — CBC
HCT: 36.5 % (ref 36.0–46.0)
Hemoglobin: 12.9 g/dL (ref 12.0–15.0)
MCH: 28.1 pg (ref 26.0–34.0)
MCHC: 35.3 g/dL (ref 30.0–36.0)
MCV: 79.5 fL (ref 78.0–100.0)
PLATELETS: 224 10*3/uL (ref 150–400)
RBC: 4.59 MIL/uL (ref 3.87–5.11)
RDW: 14.2 % (ref 11.5–15.5)
WBC: 5.5 10*3/uL (ref 4.0–10.5)

## 2013-02-14 LAB — COMPREHENSIVE METABOLIC PANEL
ALK PHOS: 141 U/L — AB (ref 39–117)
ALT: 45 U/L — AB (ref 0–35)
AST: 70 U/L — ABNORMAL HIGH (ref 0–37)
Albumin: 2.3 g/dL — ABNORMAL LOW (ref 3.5–5.2)
BUN: 9 mg/dL (ref 6–23)
CALCIUM: 8.9 mg/dL (ref 8.4–10.5)
CO2: 18 meq/L — AB (ref 19–32)
Chloride: 104 mEq/L (ref 96–112)
Creatinine, Ser: 0.8 mg/dL (ref 0.50–1.10)
GFR calc Af Amer: >90 mL/min (ref >90)
GLUCOSE: 113 mg/dL — AB (ref 70–99)
POTASSIUM: 4.3 meq/L (ref 3.7–5.3)
SODIUM: 138 meq/L (ref 137–147)
Total Bilirubin: 0.5 mg/dL (ref 0.3–1.2)
Total Protein: 6.1 g/dL (ref 6.0–8.3)

## 2013-02-14 LAB — ABO/RH: ABO/RH(D): O POS

## 2013-02-14 LAB — GLUCOSE, CAPILLARY
GLUCOSE-CAPILLARY: 101 mg/dL — AB (ref 70–99)
GLUCOSE-CAPILLARY: 140 mg/dL — AB (ref 70–99)

## 2013-02-14 LAB — HEPATITIS B SURFACE ANTIGEN: HEP B S AG: NEGATIVE

## 2013-02-14 LAB — RPR: RPR Ser Ql: NONREACTIVE

## 2013-02-14 LAB — RAPID HIV SCREEN (WH-MAU): Rapid HIV Screen: NONREACTIVE

## 2013-02-14 SURGERY — Surgical Case
Anesthesia: Spinal | Site: Abdomen

## 2013-02-14 MED ORDER — LACTATED RINGERS IV SOLN
INTRAVENOUS | Status: DC | PRN
  Administered 2013-02-14: 13:00:00 via INTRAVENOUS

## 2013-02-14 MED ORDER — FLEET ENEMA 7-19 GM/118ML RE ENEM: 1.0000 | ENEMA | Freq: Every day | RECTAL | Status: DC | PRN

## 2013-02-14 MED ORDER — SENNOSIDES-DOCUSATE SODIUM 8.6-50 MG PO TABS
2.0000 | ORAL_TABLET | ORAL | Status: DC
  Administered 2013-02-15 – 2013-02-17 (×3): 2 via ORAL
  Filled 2013-02-14 (×3): qty 2

## 2013-02-14 MED ORDER — DIBUCAINE 1 % RE OINT
1.0000 | TOPICAL_OINTMENT | RECTAL | Status: DC | PRN
Start: 2013-02-14 — End: 2013-02-17

## 2013-02-14 MED ORDER — KETOROLAC TROMETHAMINE 30 MG/ML IJ SOLN
30.0000 mg | Freq: Four times a day (QID) | INTRAMUSCULAR | Status: DC | PRN
  Administered 2013-02-14: 30 mg via INTRAMUSCULAR

## 2013-02-14 MED ORDER — SODIUM CHLORIDE 0.9 % IJ SOLN: 3.0000 mL | INTRAMUSCULAR | Status: DC | PRN

## 2013-02-14 MED ORDER — MEPERIDINE HCL 25 MG/ML IJ SOLN: 6.2500 mg | INTRAMUSCULAR | Status: DC | PRN

## 2013-02-14 MED ORDER — KETOROLAC TROMETHAMINE 30 MG/ML IJ SOLN
INTRAMUSCULAR | Status: AC
  Administered 2013-02-14: 16:00:00 30 mg via INTRAMUSCULAR
  Filled 2013-02-14: qty 1

## 2013-02-14 MED ORDER — LANOLIN HYDROUS EX OINT: 1.0000 "application " | TOPICAL_OINTMENT | CUTANEOUS | Status: DC | PRN

## 2013-02-14 MED ORDER — SIMETHICONE 80 MG PO CHEW
80.0000 mg | CHEWABLE_TABLET | ORAL | Status: DC
  Administered 2013-02-15 – 2013-02-17 (×3): 80 mg via ORAL
  Filled 2013-02-14 (×3): qty 1

## 2013-02-14 MED ORDER — NALOXONE HCL 1 MG/ML IJ SOLN
1.0000 ug/kg/h | INTRAVENOUS | Status: DC | PRN
  Filled 2013-02-14: qty 2

## 2013-02-14 MED ORDER — DIPHENHYDRAMINE HCL 50 MG/ML IJ SOLN: 12.5000 mg | INTRAMUSCULAR | Status: DC | PRN

## 2013-02-14 MED ORDER — ACETAMINOPHEN 500 MG PO TABS: 1000.0000 mg | ORAL_TABLET | Freq: Four times a day (QID) | ORAL | Status: DC

## 2013-02-14 MED ORDER — ONDANSETRON HCL 4 MG/2ML IJ SOLN
INTRAMUSCULAR | Status: AC
  Filled 2013-02-14: qty 2

## 2013-02-14 MED ORDER — BISACODYL 10 MG RE SUPP: 10.0000 mg | Freq: Every day | RECTAL | Status: DC | PRN

## 2013-02-14 MED ORDER — PHENYLEPHRINE 8 MG IN D5W 100 ML (0.08MG/ML) PREMIX OPTIME
INJECTION | INTRAVENOUS | Status: AC
  Filled 2013-02-14: qty 100

## 2013-02-14 MED ORDER — OXYTOCIN 40 UNITS IN LACTATED RINGERS INFUSION - SIMPLE MED: 62.5000 mL/h | INTRAVENOUS | Status: AC

## 2013-02-14 MED ORDER — ONDANSETRON HCL 4 MG/2ML IJ SOLN: 4.0000 mg | Freq: Three times a day (TID) | INTRAMUSCULAR | Status: DC | PRN

## 2013-02-14 MED ORDER — PROMETHAZINE HCL 25 MG/ML IJ SOLN: 6.2500 mg | INTRAMUSCULAR | Status: DC | PRN

## 2013-02-14 MED ORDER — BUPIVACAINE HCL (PF) 0.5 % IJ SOLN
INTRAMUSCULAR | Status: AC
  Filled 2013-02-14: qty 30

## 2013-02-14 MED ORDER — DIPHENHYDRAMINE HCL 25 MG PO CAPS: 25.0000 mg | ORAL_CAPSULE | Freq: Four times a day (QID) | ORAL | Status: DC | PRN

## 2013-02-14 MED ORDER — BUPIVACAINE IN DEXTROSE 0.75-8.25 % IT SOLN
INTRATHECAL | Status: DC | PRN
  Administered 2013-02-14: 1.6 mL via INTRATHECAL

## 2013-02-14 MED ORDER — NALBUPHINE HCL 10 MG/ML IJ SOLN: 5.0000 mg | INTRAMUSCULAR | Status: DC | PRN

## 2013-02-14 MED ORDER — NALBUPHINE HCL 10 MG/ML IJ SOLN
5.0000 mg | INTRAMUSCULAR | Status: DC | PRN
  Filled 2013-02-14: qty 1

## 2013-02-14 MED ORDER — DIPHENHYDRAMINE HCL 50 MG/ML IJ SOLN: 25.0000 mg | INTRAMUSCULAR | Status: DC | PRN

## 2013-02-14 MED ORDER — SCOPOLAMINE 1 MG/3DAYS TD PT72
1.0000 | MEDICATED_PATCH | Freq: Once | TRANSDERMAL | Status: DC
  Administered 2013-02-14: 1.5 mg via TRANSDERMAL

## 2013-02-14 MED ORDER — CITRIC ACID-SODIUM CITRATE 334-500 MG/5ML PO SOLN
30.0000 mL | Freq: Once | ORAL | Status: AC
  Administered 2013-02-14: 30 mL via ORAL
  Filled 2013-02-14: qty 15

## 2013-02-14 MED ORDER — IBUPROFEN 600 MG PO TABS
600.0000 mg | ORAL_TABLET | Freq: Four times a day (QID) | ORAL | Status: DC
  Administered 2013-02-15 – 2013-02-17 (×11): 600 mg via ORAL
  Filled 2013-02-14 (×11): qty 1

## 2013-02-14 MED ORDER — ONDANSETRON HCL 4 MG/2ML IJ SOLN
INTRAMUSCULAR | Status: DC | PRN
  Administered 2013-02-14: 4 mg via INTRAVENOUS

## 2013-02-14 MED ORDER — SCOPOLAMINE 1 MG/3DAYS TD PT72: 1.0000 | MEDICATED_PATCH | Freq: Once | TRANSDERMAL | Status: DC

## 2013-02-14 MED ORDER — KETOROLAC TROMETHAMINE 30 MG/ML IJ SOLN: 30.0000 mg | Freq: Four times a day (QID) | INTRAMUSCULAR | Status: DC | PRN

## 2013-02-14 MED ORDER — IBUPROFEN 600 MG PO TABS: 600.0000 mg | ORAL_TABLET | Freq: Four times a day (QID) | ORAL | Status: DC | PRN

## 2013-02-14 MED ORDER — ZOLPIDEM TARTRATE 5 MG PO TABS: 5.0000 mg | ORAL_TABLET | Freq: Every evening | ORAL | Status: DC | PRN

## 2013-02-14 MED ORDER — MIDAZOLAM HCL 2 MG/2ML IJ SOLN
0.5000 mg | Freq: Once | INTRAMUSCULAR | Status: DC | PRN
Start: 2013-02-14 — End: 2013-02-14

## 2013-02-14 MED ORDER — MENTHOL 3 MG MT LOZG: 1.0000 | LOZENGE | OROMUCOSAL | Status: DC | PRN

## 2013-02-14 MED ORDER — OXYTOCIN 10 UNIT/ML IJ SOLN
INTRAMUSCULAR | Status: AC
  Filled 2013-02-14: qty 4

## 2013-02-14 MED ORDER — FAMOTIDINE IN NACL 20-0.9 MG/50ML-% IV SOLN
20.0000 mg | Freq: Once | INTRAVENOUS | Status: AC
  Administered 2013-02-14: 20 mg via INTRAVENOUS
  Filled 2013-02-14: qty 50

## 2013-02-14 MED ORDER — ONDANSETRON HCL 4 MG/2ML IJ SOLN
4.0000 mg | INTRAMUSCULAR | Status: DC | PRN
  Administered 2013-02-14: 4 mg via INTRAVENOUS
  Filled 2013-02-14: qty 2

## 2013-02-14 MED ORDER — NALOXONE HCL 1 MG/ML IJ SOLN: 1.0000 ug/kg/h | INTRAVENOUS | Status: DC | PRN

## 2013-02-14 MED ORDER — SCOPOLAMINE 1 MG/3DAYS TD PT72
MEDICATED_PATCH | TRANSDERMAL | Status: AC
  Filled 2013-02-14: qty 1

## 2013-02-14 MED ORDER — METOCLOPRAMIDE HCL 5 MG/ML IJ SOLN: 10.0000 mg | Freq: Three times a day (TID) | INTRAMUSCULAR | Status: DC | PRN

## 2013-02-14 MED ORDER — DEXTROSE 5 % IV SOLN
3.0000 g | INTRAVENOUS | Status: AC
  Administered 2013-02-14: 3 g via INTRAVENOUS
  Filled 2013-02-14: qty 3000

## 2013-02-14 MED ORDER — LACTATED RINGERS IV SOLN
INTRAVENOUS | Status: DC | PRN
  Administered 2013-02-14 (×3): via INTRAVENOUS

## 2013-02-14 MED ORDER — ACETAMINOPHEN 500 MG PO TABS
1000.0000 mg | ORAL_TABLET | Freq: Four times a day (QID) | ORAL | Status: AC
  Administered 2013-02-15 (×2): 1000 mg via ORAL
  Filled 2013-02-14 (×2): qty 2

## 2013-02-14 MED ORDER — SIMETHICONE 80 MG PO CHEW
80.0000 mg | CHEWABLE_TABLET | Freq: Three times a day (TID) | ORAL | Status: DC
  Administered 2013-02-15 – 2013-02-17 (×7): 80 mg via ORAL
  Filled 2013-02-14 (×7): qty 1

## 2013-02-14 MED ORDER — PHENYLEPHRINE 8 MG IN D5W 100 ML (0.08MG/ML) PREMIX OPTIME
INJECTION | INTRAVENOUS | Status: DC | PRN
  Administered 2013-02-14: 60 ug/min via INTRAVENOUS

## 2013-02-14 MED ORDER — TERBUTALINE SULFATE 1 MG/ML IJ SOLN
0.2500 mg | Freq: Once | INTRAMUSCULAR | Status: AC
  Administered 2013-02-14: 0.25 mg via SUBCUTANEOUS
  Filled 2013-02-14: qty 1

## 2013-02-14 MED ORDER — LACTATED RINGERS IV SOLN
40.0000 [IU] | INTRAVENOUS | Status: DC | PRN
  Administered 2013-02-14: 40 [IU] via INTRAVENOUS

## 2013-02-14 MED ORDER — ONDANSETRON HCL 4 MG PO TABS: 4.0000 mg | ORAL_TABLET | ORAL | Status: DC | PRN

## 2013-02-14 MED ORDER — KETOROLAC TROMETHAMINE 30 MG/ML IJ SOLN: 30.0000 mg | Freq: Four times a day (QID) | INTRAMUSCULAR | Status: AC | PRN

## 2013-02-14 MED ORDER — TETANUS-DIPHTH-ACELL PERTUSSIS 5-2.5-18.5 LF-MCG/0.5 IM SUSP: 0.5000 mL | Freq: Once | INTRAMUSCULAR | Status: DC

## 2013-02-14 MED ORDER — LACTATED RINGERS IV SOLN
INTRAVENOUS | Status: DC
  Administered 2013-02-14 – 2013-02-15 (×2): via INTRAVENOUS

## 2013-02-14 MED ORDER — PHENYLEPHRINE HCL 10 MG/ML IJ SOLN
INTRAMUSCULAR | Status: DC | PRN
  Administered 2013-02-14: 80 ug via INTRAVENOUS

## 2013-02-14 MED ORDER — SCOPOLAMINE 1 MG/3DAYS TD PT72
MEDICATED_PATCH | TRANSDERMAL | Status: AC
  Administered 2013-02-14: 14:00:00 1.5 mg via TRANSDERMAL
  Filled 2013-02-14: qty 1

## 2013-02-14 MED ORDER — SIMETHICONE 80 MG PO CHEW
80.0000 mg | CHEWABLE_TABLET | ORAL | Status: DC | PRN
Start: 2013-02-14 — End: 2013-02-17

## 2013-02-14 MED ORDER — OXYCODONE-ACETAMINOPHEN 5-325 MG PO TABS
1.0000 | ORAL_TABLET | ORAL | Status: DC | PRN
  Administered 2013-02-15 – 2013-02-17 (×3): 1 via ORAL
  Filled 2013-02-14 (×3): qty 1

## 2013-02-14 MED ORDER — MORPHINE SULFATE (PF) 0.5 MG/ML IJ SOLN
INTRAMUSCULAR | Status: DC | PRN
  Administered 2013-02-14: .15 mg via INTRATHECAL

## 2013-02-14 MED ORDER — DIPHENHYDRAMINE HCL 25 MG PO CAPS: 25.0000 mg | ORAL_CAPSULE | ORAL | Status: DC | PRN

## 2013-02-14 MED ORDER — FENTANYL CITRATE 0.05 MG/ML IJ SOLN
INTRAMUSCULAR | Status: DC | PRN
  Administered 2013-02-14: 25 ug via INTRATHECAL

## 2013-02-14 MED ORDER — BUPIVACAINE HCL (PF) 0.5 % IJ SOLN
INTRAMUSCULAR | Status: DC | PRN
  Administered 2013-02-14: 30 mL

## 2013-02-14 MED ORDER — MORPHINE SULFATE 0.5 MG/ML IJ SOLN
INTRAMUSCULAR | Status: AC
  Filled 2013-02-14: qty 10

## 2013-02-14 MED ORDER — DIPHENHYDRAMINE HCL 25 MG PO CAPS
25.0000 mg | ORAL_CAPSULE | ORAL | Status: DC | PRN
  Filled 2013-02-14: qty 1

## 2013-02-14 MED ORDER — NALOXONE HCL 0.4 MG/ML IJ SOLN: 0.4000 mg | INTRAMUSCULAR | Status: DC | PRN

## 2013-02-14 MED ORDER — WITCH HAZEL-GLYCERIN EX PADS: 1.0000 "application " | MEDICATED_PAD | CUTANEOUS | Status: DC | PRN

## 2013-02-14 MED ORDER — PRENATAL MULTIVITAMIN CH
1.0000 | ORAL_TABLET | Freq: Every day | ORAL | Status: DC
  Administered 2013-02-15 – 2013-02-17 (×3): 1 via ORAL
  Filled 2013-02-14 (×3): qty 1

## 2013-02-14 MED ORDER — METOCLOPRAMIDE HCL 5 MG/ML IJ SOLN
10.0000 mg | Freq: Three times a day (TID) | INTRAMUSCULAR | Status: DC | PRN
  Administered 2013-02-14: 10 mg via INTRAVENOUS
  Filled 2013-02-14: qty 2

## 2013-02-14 MED ORDER — FENTANYL CITRATE 0.05 MG/ML IJ SOLN: 25.0000 ug | INTRAMUSCULAR | Status: DC | PRN

## 2013-02-14 MED ORDER — PHENYLEPHRINE 40 MCG/ML (10ML) SYRINGE FOR IV PUSH (FOR BLOOD PRESSURE SUPPORT)
PREFILLED_SYRINGE | INTRAVENOUS | Status: AC
  Filled 2013-02-14: qty 5

## 2013-02-14 MED ORDER — FENTANYL CITRATE 0.05 MG/ML IJ SOLN
INTRAMUSCULAR | Status: AC
  Filled 2013-02-14: qty 2

## 2013-02-14 SURGICAL SUPPLY — 41 items
BARRIER ADHS 3X4 INTERCEED (GAUZE/BANDAGES/DRESSINGS) IMPLANT
CLAMP CORD UMBIL (MISCELLANEOUS) IMPLANT
CLIP FILSHIE TUBAL LIGA STRL (Clip) ×3 IMPLANT
CLOSURE WOUND 1/2 X4 (GAUZE/BANDAGES/DRESSINGS) ×1
CLOTH BEACON ORANGE TIMEOUT ST (SAFETY) ×3 IMPLANT
CONTAINER PREFILL 10% NBF 15ML (MISCELLANEOUS) IMPLANT
DRAPE LG THREE QUARTER DISP (DRAPES) IMPLANT
DRSG OPSITE POSTOP 4X10 (GAUZE/BANDAGES/DRESSINGS) ×3 IMPLANT
DURAPREP 26ML APPLICATOR (WOUND CARE) ×3 IMPLANT
ELECT REM PT RETURN 9FT ADLT (ELECTROSURGICAL) ×3
ELECTRODE REM PT RTRN 9FT ADLT (ELECTROSURGICAL) ×1 IMPLANT
EXTRACTOR VACUUM KIWI (MISCELLANEOUS) ×3 IMPLANT
GLOVE BIO SURGEON STRL SZ 6.5 (GLOVE) ×2 IMPLANT
GLOVE BIO SURGEONS STRL SZ 6.5 (GLOVE) ×1
GOWN PREVENTION PLUS XLARGE (GOWN DISPOSABLE) ×6 IMPLANT
GOWN STRL REIN XL XLG (GOWN DISPOSABLE) ×6 IMPLANT
KIT ABG SYR 3ML LUER SLIP (SYRINGE) IMPLANT
NEEDLE HYPO 25X5/8 SAFETYGLIDE (NEEDLE) IMPLANT
NEEDLE SPNL 18GX3.5 QUINCKE PK (NEEDLE) ×3 IMPLANT
NS IRRIG 1000ML POUR BTL (IV SOLUTION) ×3 IMPLANT
PACK C SECTION WH (CUSTOM PROCEDURE TRAY) ×3 IMPLANT
PAD ABD 7.5X8 STRL (GAUZE/BANDAGES/DRESSINGS) ×3 IMPLANT
PAD OB MATERNITY 4.3X12.25 (PERSONAL CARE ITEMS) ×3 IMPLANT
RTRCTR C-SECT PINK 25CM LRG (MISCELLANEOUS) ×3 IMPLANT
STRIP CLOSURE SKIN 1/2X4 (GAUZE/BANDAGES/DRESSINGS) ×2 IMPLANT
SUT PDS AB 0 CTX 60 (SUTURE) ×3 IMPLANT
SUT PLAIN 2 0 XLH (SUTURE) ×3 IMPLANT
SUT VIC AB 0 CT1 27 (SUTURE)
SUT VIC AB 0 CT1 27XBRD ANBCTR (SUTURE) IMPLANT
SUT VIC AB 0 CT1 36 (SUTURE) IMPLANT
SUT VIC AB 2-0 CT1 27 (SUTURE) ×2
SUT VIC AB 2-0 CT1 TAPERPNT 27 (SUTURE) ×1 IMPLANT
SUT VIC AB 2-0 CTX 36 (SUTURE) ×6 IMPLANT
SUT VIC AB 3-0 CT1 27 (SUTURE) ×2
SUT VIC AB 3-0 CT1 TAPERPNT 27 (SUTURE) ×1 IMPLANT
SUT VIC AB 3-0 SH 27 (SUTURE)
SUT VIC AB 3-0 SH 27X BRD (SUTURE) IMPLANT
SYR 30ML LL (SYRINGE) ×3 IMPLANT
TOWEL OR 17X24 6PK STRL BLUE (TOWEL DISPOSABLE) ×3 IMPLANT
TRAY FOLEY CATH 14FR (SET/KITS/TRAYS/PACK) ×3 IMPLANT
WATER STERILE IRR 1000ML POUR (IV SOLUTION) ×3 IMPLANT

## 2013-02-14 NOTE — Anesthesia Postprocedure Evaluation (Signed)
Anesthesia Post Note  Patient: Pam [name]  Procedure(s) Performed: Procedure(s) (LRB): CESAREAN SECTION, Bilateral Tubal Ligation (N/A)  Anesthesia type: Spinal  Patient location: PACU  Post pain: Pain level controlled  Post assessment: Post-op Vital signs reviewed  Last Vitals:  Filed Vitals:   02/14/13 1415  BP: 111/51  Pulse: 84  Temp:   Resp: 62    Post vital signs: Reviewed  Level of consciousness: awake  Complications: No apparent anesthesia complications

## 2013-02-14 NOTE — MAU Note (Signed)
Pt states she's has been experiencing U/C's since 0700 this am every 2-2.5 min.  No vaginal bleeding or ROM.  Good fetal movement.  Hx of primary C/S due to fetal distress.  Pt has not had any PNC since 20 weeks after [facility].  Pt has had some HTN with this pregnancy.  Hx. Of gestational diabetes with first pregnancy but pt states she has not had any issues with this current pregnancy.

## 2013-02-14 NOTE — MAU Provider Note (Signed)
Pam [name] at 6858w4d presents in early labor with limited PNC and hx C/S for fetal indication, max dilation 5cm. Hx A2GDM (BW 7#14), clinically LGA now. Unsure re TOLAC> counseled on R/B C/S vs TOLAC and Dr. Marice Potterove consulted to see pt. Pt elects repeat C/S. See Admission H&P [name], CNM 02/14/2013 11:13 AM

## 2013-02-14 NOTE — Transfer of Care (Signed)
Immediate Anesthesia Transfer of Care Note  Patient: Pam [name]  Procedure(s) Performed: Procedure(s): CESAREAN SECTION, Bilateral Tubal Ligation (N/A)  Patient Location: PACU  Anesthesia Type:Regional  Level of Consciousness: awake, alert , oriented and patient cooperative  Airway & Oxygen Therapy: Patient Spontanous Breathing  Post-op Assessment: Report given to PACU RN and Post -op Vital signs reviewed and stable  Post vital signs: stable  Complications: No apparent anesthesia complications

## 2013-02-14 NOTE — Op Note (Signed)
Pam FaesJada Y Rede PROCEDURE DATE: 02/14/2013  PREOPERATIVE DIAGNOSES: Intrauterine pregnancy at  6669w4d weeks gestation; previous uterine incision kronig; undesired fertility, preeclamptic  POSTOPERATIVE DIAGNOSES: The same  PROCEDURE: Repeat Low Transverse Cesarean Section, Bilateral Tubal Sterilization using Filshie clips  SURGEON:  Dr. Nicholaus BloomMyra Dove  ASSISTANT:  Dr. Jolyn LentMichael Junah Yam  INDICATIONS: Pam [name] is a 28 y.o. [id number] at 4769w4d here for cesarean section and bilateral tubal sterilization secondary to the indications listed under preoperative diagnoses; please see preoperative note for further details.  The risks of surgery were discussed with the patient including but were not limited to: bleeding which may require transfusion or reoperation; infection which may require antibiotics; injury to bowel, bladder, ureters or other surrounding organs; injury to the fetus; need for additional procedures including hysterectomy in the event of a life-threatening hemorrhage; placental abnormalities wth subsequent pregnancies, incisional problems, thromboembolic phenomenon and other postoperative/anesthesia complications.  Patient also desires permanent sterilization.  Other reversible forms of contraception were discussed with patient; she declines all other modalities. Risks of procedure discussed with patient including but not limited to: risk of regret, permanence of method, bleeding, infection, injury to surrounding organs and need for additional procedures.  Failure risk of 0.5-1% with increased risk of ectopic gestation if pregnancy occurs was also discussed with patient.  The patient concurred with the proposed plan, giving informed written consent for the procedures.    FINDINGS:  Viable female infant in cephalic presentation.  Apgars 3 and 8.  Clear amniotic fluid.  Intact placenta, three vessel cord.  Normal uterus, fallopian tubes and ovaries bilaterally. Cord pH 6.9  ANESTHESIA: Spinal INTRAVENOUS  FLUIDS: 2400 ml ESTIMATED BLOOD LOSS: 600 ml URINE OUTPUT:  175 ml SPECIMENS: Placenta sent to L&D COMPLICATIONS: None immediate  PROCEDURE IN DETAIL:  The patient preoperatively received intravenous antibiotics and had sequential compression devices applied to her lower extremities.   She was then taken to the operating room where spinal anesthesia was administered and was found to be adequate. She was then placed in a dorsal supine position with a leftward tilt, and prepped and draped in a sterile manner.  30 ml of 0.5% Marcaine was injected subcutaneously around the incision site. A foley catheter was placed into her bladder and attached to constant gravity.  After an adequate timeout was performed, a Pfannenstiel skin incision was made with scalpel and carried through to the underlying layer of fascia. The fascia was incised in the midline, and this incision was extended bilaterally using the Mayo scissors. The rectus muscles were partially transected medial 20% with bovie. Hemostasis was maintained. Peritoneum was entered with hemostats and expanded with bovie. Attention was turned to the lower uterine segment where a low transverse hysterotomy was made with a scalpel and extended bilaterally bluntly.  The infant was successfully delivered, the cord was clamped, sample sent for pH, and cut and the infant was handed over to awaiting neonatology team. Moderate meconium was noted.  Uterine massage was then administered, and the placenta delivered intact with a three-vessel cord. The uterus was then cleared of clot and debris.  The hysterotomy was closed with 2.0 Vicryl in a running locked fashion. Single Layer closure. Attention was then turned to the fallopian tubes.  A Filshie clip was placed on both tubes, in the isthmic region, with care given to incorporate the underlying mesosalpinx on both sides, allowing for bilateral tubal sterilization. The pelvis was cleared of all clot and debris. Hemostasis was  confirmed on all surfaces. The fascia  was then closed using 0 PDS looped in a running fashion.  The subcutaneous layer was irrigated, then reapproximated with 2-0 plain gut interrupted stitches. The skin was closed with a 4-0 Vicryl subcuticular stitch. The patient tolerated the procedure well. Sponge, lap, instrument and needle counts were correct x 2.  She was taken to the recovery room in stable condition.   Tawana Scale, MD OB Fellow

## 2013-02-14 NOTE — Lactation Note (Signed)
This note was copied from the chart of Boy Noelle PennerJada Rowlands. Lactation Consultation Note  Patient Name: [name] BJYNW'GToday's Date: 02/14/2013 Reason for consult: Initial assessment of this mom and baby 7 hours after C/S delivery.  This is mom's second child and she breastfed first for 6 months.  Per mom and initial feeding record, newborn has latched well with LATCH score=10 but mom did feed formula once when "too tired to feed" post C/S.  LC encouraged STS and cue feedings  LC encouraged review of Baby and Me pp 9, 14 and 20-25 for STS and BF information. LC provided [facility] Resource brochure and reviewed [facility] services and list of community and web site resources.    Maternal Data Formula Feeding for Exclusion: No Infant to breast within first hour of birth: Yes (initial LATCH score=10) Has patient been taught Hand Expression?: Yes (mom informs LC that she knwos how to express milk by hand) Does the patient have breastfeeding experience prior to this delivery?: Yes  Feeding Feeding Type: Bottle Fed - Formula Length of feed: 10 min  LATCH Score/Interventions              initial LATCH score=10        Lactation Tools Discussed/Used   STS, cue feedings ad lib, hand expression  Consult Status Consult Status: Follow-up Date: 02/15/13 Follow-up type: In-patient    Warrick ParisianBryant, Celedonio Sortino [facility] 02/14/2013, 8:52 PM

## 2013-02-14 NOTE — H&P (Signed)
Pam [name] is a 28 y.o. female G2P1001 with IUP at [redacted]w[redacted]d presenting for painful contractions. Pt initially had prenatal care at [facility] and was dishcarged at [facility]. Has not had care since. Pt reports having painful contractions starting today at 7am. Pt has not had LOF no vb and normal fetal movement. Pt has hx of Csection and hx of GDMA2 but did not receive treatment this pregancny. After discussings risks of untreated diabetes and shoulder dystocia, pt elects for repeat section.  Prenatal History/Complications: No PNC  Past Medical History: Past Medical History  Diagnosis Date  . Gestational diabetes   . Hypertension     Past Surgical History: Past Surgical History  Procedure Laterality Date  . Wisdom tooth extraction  2011  . Cesarean section  07/12/2011    Procedure: CESAREAN SECTION;  Surgeon: Kirkland Hun, MD;  Location: [facility];  Service: Gynecology;  Laterality: N/A;    Obstetrical History: OB History   Grav Para Term Preterm Abortions TAB SAB Ect Mult Living   2 1 1  0 0 0 0 0 0 1      Gynecological History: OB History   Grav Para Term Preterm Abortions TAB SAB Ect Mult Living   2 1 1  0 0 0 0 0 0 1      Social History: History   Social History  . Marital Status: Single    Spouse Name: N/A    Number of Children: N/A  . Years of Education: N/A   Social History Main Topics  . Smoking status: Never Smoker   . Smokeless tobacco: Never Used  . Alcohol Use: No  . Drug Use: No  . Sexual Activity: Yes    Birth Control/ Protection: None   Other Topics Concern  . None   Social History Narrative  . None    Family History: History reviewed. No pertinent family history.  Allergies: No Known Allergies  Prescriptions prior to admission  Medication Sig Dispense Refill  . Ascorbic Acid (VITAMIN C) 1000 MG tablet Take 1,000 mg by mouth once.      . Prenatal Vit-Fe Fumarate-FA (PRENATAL MULTIVITAMIN) TABS tablet Take 1 tablet by mouth daily at 12 noon.          Review of Systems   Painful ctx. Unable to give thorough ROS 2/2 pain. No other specific complaints.   Blood pressure 139/86, pulse 105, temperature 98.4 F (36.9 C), temperature source Oral, resp. rate 20, height 5' 6.5" (1.689 m), weight 155.584 kg (343 lb), SpO2 98.00%. General appearance: alert, cooperative, appears stated age and no distress Lungs: clear to auscultation bilaterally Heart: regular rate and rhythm Abdomen: soft, non-tender; bowel sounds normal Extremities: Homans sign is negative, no sign of DVT Presentation: cephalic per nursing exam  Fetal monitoringBaseline: 130s bpm, Variability: Good {> 6 bpm), Accelerations: Reactive and Decelerations: mostly early decels, 1 late and mild with quick recovery. no variables Uterine activity q2-4  Dilation: 3.5 Effacement (%): 80 Station: -2 Exam by:: L.Paschal, RN   Prenatal labs: ABO, Rh: --/--/O POS (01/05 1143) Antibody: NEG (01/05 1143) Rubella:   RPR:    HBsAg:    HIV:    GBS:      Prenatal Transfer Tool  Maternal Diabetes: Unknown - hx of GDMA2 with prior preg Genetic Screening: Not checked Maternal Ultrasounds/Referrals: nml 20wk per patient Fetal Ultrasounds or other Referrals:  None Maternal Substance Abuse:  No Significant Maternal Medications:  None Significant Maternal Lab Results: Lab values include: Group B Strep positive  Results for orders placed during the hospital encounter of 02/14/13 (from the past 24 hour(s))  GLUCOSE, CAPILLARY   Collection Time    02/14/13 11:23 AM      Result Value Range   Glucose-Capillary 101 (*) 70 - 99 mg/dL  CBC   Collection Time    02/14/13 11:25 AM      Result Value Range   WBC 5.5  4.0 - 10.5 K/uL   RBC 4.59  3.87 - 5.11 MIL/uL   Hemoglobin 12.9  12.0 - 15.0 g/dL   HCT 82.936.5  56.236.0 - 13.046.0 %   MCV 79.5  78.0 - 100.0 fL   MCH 28.1  26.0 - 34.0 pg   MCHC 35.3  30.0 - 36.0 g/dL   RDW 86.514.2  78.411.5 - 69.615.5 %   Platelets 224  150 - 400 K/uL   DIFFERENTIAL   Collection Time    02/14/13 11:25 AM      Result Value Range   Neutrophils Relative % 59  43 - 77 %   Neutro Abs 3.2  1.7 - 7.7 K/uL   Lymphocytes Relative 27  12 - 46 %   Lymphs Abs 1.5  0.7 - 4.0 K/uL   Monocytes Relative 12  3 - 12 %   Monocytes Absolute 0.7  0.1 - 1.0 K/uL   Eosinophils Relative 1  0 - 5 %   Eosinophils Absolute 0.1  0.0 - 0.7 K/uL   Basophils Relative 0  0 - 1 %   Basophils Absolute 0.0  0.0 - 0.1 K/uL  TYPE AND SCREEN   Collection Time    02/14/13 11:43 AM      Result Value Range   ABO/RH(D) O POS     Antibody Screen NEG     Sample Expiration 02/17/2013      Assessment: Pam [name] is a 28 y.o. G2P1001 at 6881w4d  here for Labor, desires RLTCS #Labor: Move to OR, ancef 3g, SCD, foley, will repeat c-section now. #Pain: Defer to anethesiology - spinal vs epidrual #FWB:  Cat II #ID:  GBS +, Ancef 3g #No prenatal care: UDS, PNL, SW consult #elevated blood pressure/Preeclampsa: elevated AST. nml Cr, plt #MOC: Desires sterilization.  Risks and benefits of RLTCS discussed with pt by Dr. Marice Potterove. Agree with plan to move to section. Pt has no questions at this time.  Tawana ScaleODOM, Harlene Petralia RYAN 02/14/2013, 12:25 PM

## 2013-02-14 NOTE — Progress Notes (Signed)
I do not need to delay the surgery to wait on the type and screen.

## 2013-02-14 NOTE — Anesthesia Preprocedure Evaluation (Addendum)
Anesthesia Evaluation  Patient identified by MRN, date of birth, ID band Patient awake    Reviewed: Allergy & Precautions, H&P , NPO status , Patient's Chart, lab work & pertinent test results  Airway Mallampati: II      Dental   Pulmonary  breath sounds clear to auscultation        Cardiovascular Exercise Tolerance: Good hypertension, Rhythm:regular Rate:Normal     Neuro/Psych    GI/Hepatic   Endo/Other  diabetesMorbid obesity  Renal/GU      Musculoskeletal   Abdominal   Peds  Hematology   Anesthesia Other Findings   Reproductive/Obstetrics (+) Pregnancy                          Anesthesia Physical Anesthesia Plan  ASA: III and emergent  Anesthesia Plan: Spinal   Post-op Pain Management:    Induction:   Airway Management Planned:   Additional Equipment:   Intra-op Plan:   Post-operative Plan:   Informed Consent: I have reviewed the patients History and Physical, chart, labs and discussed the procedure including the risks, benefits and alternatives for the proposed anesthesia with the patient or authorized representative who has indicated his/her understanding and acceptance.     Plan Discussed with: Anesthesiologist, CRNA and Surgeon  Anesthesia Plan Comments:        Anesthesia Quick Evaluation

## 2013-02-14 NOTE — Anesthesia Procedure Notes (Signed)
Spinal  Patient location during procedure: OR Start time: 02/14/2013 12:41 PM Staffing Anesthesiologist: Brayton CavesJACKSON, Tauriel Scronce Performed by: anesthesiologist  Preanesthetic Checklist Completed: patient identified, site marked, surgical consent, pre-op evaluation, timeout performed, IV checked, risks and benefits discussed and monitors and equipment checked Spinal Block Patient position: sitting Prep: DuraPrep Patient monitoring: heart rate, cardiac monitor, continuous pulse ox and blood pressure Approach: midline Location: L3-4 Injection technique: single-shot Needle Needle type: Sprotte  Needle gauge: 24 G Needle length: 9 cm Assessment Sensory level: T4 Additional Notes Patient identified.  Risk benefits discussed including failed block, incomplete pain control, headache, nerve damage, paralysis, blood pressure changes, nausea, vomiting, reactions to medication both toxic or allergic, and postpartum back pain.  Patient expressed understanding and wished to proceed.  All questions were answered.  Sterile technique used throughout procedure.  CSF was clear.  No parasthesia or other complications.  Please see nursing notes for vital signs.

## 2013-02-15 ENCOUNTER — Encounter (HOSPITAL_COMMUNITY): Payer: Self-pay | Admitting: Obstetrics & Gynecology

## 2013-02-15 LAB — COMPREHENSIVE METABOLIC PANEL
ALT: 41 U/L — ABNORMAL HIGH (ref 0–35)
AST: 60 U/L — ABNORMAL HIGH (ref 0–37)
Albumin: 2.1 g/dL — ABNORMAL LOW (ref 3.5–5.2)
Alkaline Phosphatase: 119 U/L — ABNORMAL HIGH (ref 39–117)
BUN: 9 mg/dL (ref 6–23)
CALCIUM: 8.8 mg/dL (ref 8.4–10.5)
CO2: 22 meq/L (ref 19–32)
CREATININE: 0.99 mg/dL (ref 0.50–1.10)
Chloride: 106 mEq/L (ref 96–112)
GFR calc non Af Amer: 76 mL/min — ABNORMAL LOW (ref >90)
GFR, EST AFRICAN AMERICAN: 88 mL/min — AB (ref >90)
GLUCOSE: 146 mg/dL — AB (ref 70–99)
Potassium: 4.5 mEq/L (ref 3.7–5.3)
SODIUM: 140 meq/L (ref 137–147)
TOTAL PROTEIN: 5.5 g/dL — AB (ref 6.0–8.3)
Total Bilirubin: 0.6 mg/dL (ref 0.3–1.2)

## 2013-02-15 LAB — CBC
HCT: 30.8 % — ABNORMAL LOW (ref 36.0–46.0)
Hemoglobin: 10.6 g/dL — ABNORMAL LOW (ref 12.0–15.0)
MCH: 27.7 pg (ref 26.0–34.0)
MCHC: 34.4 g/dL (ref 30.0–36.0)
MCV: 80.4 fL (ref 78.0–100.0)
PLATELETS: 194 10*3/uL (ref 150–400)
RBC: 3.83 MIL/uL — ABNORMAL LOW (ref 3.87–5.11)
RDW: 14.1 % (ref 11.5–15.5)
WBC: 8.9 10*3/uL (ref 4.0–10.5)

## 2013-02-15 LAB — RUBELLA SCREEN: RUBELLA: 1.27 {index} — AB (ref <0.90)

## 2013-02-15 MED ORDER — BENZONATATE 100 MG PO CAPS
100.0000 mg | ORAL_CAPSULE | Freq: Three times a day (TID) | ORAL | Status: DC | PRN
  Administered 2013-02-15: 100 mg via ORAL
  Filled 2013-02-15: qty 1

## 2013-02-15 NOTE — Progress Notes (Signed)
Subjective: Postpartum Day 1: Cesarean Delivery Patient reports tolerating PO.   Just had catheter removed this AM and hasn't voided. No gas yet. Has not been up moving around.   Objective: Vital signs in last 24 hours: Temp:  [97.6 F (36.4 C)-98.5 F (36.9 C)] 98.3 F (36.8 C) (01/06 0345) Pulse Rate:  [70-105] 97 (01/06 0345) Resp:  [20-26] 20 (01/06 0345) BP: (111-165)/(44-105) 144/88 mmHg (01/06 0345) SpO2:  [92 %-100 %] 97 % (01/06 0345) Weight:  [155.584 kg (343 lb)] 155.584 kg (343 lb) (01/05 0935)  Physical Exam:  General: alert, cooperative and no distress Lochia: appropriate Uterine Fundus: firm Incision: na DVT Evaluation: No evidence of DVT seen on physical exam. No cords or calf tenderness. No significant calf/ankle edema.   Recent Labs  02/14/13 1125 02/15/13 0639  HGB 12.9 10.6*  HCT 36.5 30.8*    Assessment/Plan: Status post Cesarean section. Doing well postoperatively.  Continue current care. S/p BTL  Pam [name] L 02/15/2013, 7:49 AM

## 2013-02-15 NOTE — Clinical Social Work Maternal (Signed)
Clinical Social Work Department  PSYCHOSOCIAL ASSESSMENT - MATERNAL/CHILD  02/15/2013  Patient: [name],[name] Account Number: [id number] Admit Date: 02/14/2013  Childs Name:  Pam [name]   Clinical Social Worker: Kameron Glazebrook, LCSW Date/Time: 02/15/2013 04:17 PM  Date Referred: 02/15/2013  Referral source   CN    Referred reason   Other - See comment   Other referral source:  I: FAMILY / HOME ENVIRONMENT  Child's legal guardian: PARENT  Guardian - Name  Guardian - Age  Guardian - Address   Pam [name]  27  [address]; [place], [place] [address]   Pam [name]  26    Other household support members/support persons  Name  Relationship  DOB   Pam [name]  MOTHER     SON  07/12/11   Other support:  II PSYCHOSOCIAL DATA  Information Source: Patient Interview  Financial and Community Resources  Employment:  Financial resources: Medicaid  If Medicaid - County: [place]  Other   Food Stamps   WIC   School / Grade:  Maternity Care Coordinator / Child Services Coordination / Early Interventions: Cultural issues impacting care:  III STRENGTHS  Strengths   Adequate Resources   Home prepared for Child (including basic supplies)   Supportive family/friends   Strength comment:  IV RISK FACTORS AND CURRENT PROBLEMS  Current Problem: YES  Risk Factor & Current Problem  Patient Issue  Family Issue  Risk Factor / Current Problem Comment   Other - See comment  Y  N  Limited PNC   V SOCIAL WORK ASSESSMENT  CSW referral received to assess pt's reason for NPNC after 20 weeks. Pt started PNC at [facility] however could not continue care due to insurance provider. According to pt, [facility] would only accept pregnancy Medicaid & she had regular Medicaid. Since pt was not able obtain pregnancy Medicaid (for reasons not verbalized), she did not establish PNC at another facility. She denies any illegal substance use & verbalized an understanding of hospital drug testing  policy. UDS is negative, meconium results are pending. Pt has all the necessary supplies for the infant & seems to be bonding well. She has applied for [employer] benefits. She identified the FOB & her mother as her primary support system. CSW will continue to monitor drug screen results & make a referral if needed.   VI SOCIAL WORK PLAN  Social Work Plan   No Further Intervention Required / No Barriers to Discharge   Type of pt/family education:  If child protective services report - county:  If child protective services report - date:  Information/referral to community resources comment:  Other social work plan:       Clinical Social Work Department PSYCHOSOCIAL ASSESSMENT - MATERNAL/CHILD 02/15/2013  Patient:  Pam [name], Pam [name]  Account Number:  [id number]  Admit Date:  02/14/2013  Marjo Bicker Name:   Pam [name]    Clinical Social Worker:  Nobie Putnam, LCSW   Date/Time:  02/15/2013 04:17 PM  Date Referred:  02/15/2013   Referral source  CN     Referred reason  Other - See comment   Other referral source:    I:  FAMILY / HOME ENVIRONMENT Child's legal guardian:  PARENT  Guardian - Name Guardian - Age Guardian - Address  Pam [name] [address] [place]; [place], [place] [address]  Pam [name] 26    Other household support members/support persons Name Relationship DOB  Pam [name] MOTHER    SON 07/12/11   Other support:    II  PSYCHOSOCIAL DATA Information Source:  Patient Interview  Event organiser Employment:   Financial resources:  Medicaid If Medicaid - County:  [place] Other  Sales executive  WIC   School / Grade:   Maternity Care Coordinator / Child Services Coordination / Early Interventions:  Cultural issues impacting care:    III  STRENGTHS Strengths  Adequate Resources  Home prepared for Child (including basic supplies)  Supportive family/friends   Strength comment:    IV  RISK FACTORS AND CURRENT PROBLEMS Current Problem:  YES   Risk Factor & Current Problem Patient Issue Family Issue Risk Factor / Current Problem Comment  Other - See comment Y N Limited PNC    V  SOCIAL WORK ASSESSMENT CSW referral received to assess pt's reason for [facility] after 20 weeks.  Pt started [facility] however could not continue care due to insurance provider.  According to pt, [facility] would only accept pregnancy Medicaid & she had regular Medicaid.  Since pt was not able obtain pregnancy Medicaid (for reasons not verbalized), she did not establish PNC at another facility.  She denies any illegal substance use & verbalized an  understanding of hospital drug testing policy.  UDS is negative, meconium results are pending.  Pt has all the necessary supplies for the infant & seems to be bonding well.  She has applied for [employer] benefits.  She identified the FOB & her mother as her primary support system.  CSW will continue to monitor drug screen results & make a referral if needed.      VI SOCIAL WORK PLAN Social Work Plan  No Further Intervention Required / No Barriers to Discharge   Type of pt/family education:   If child protective services report - county:   If child protective services report - date:   Information/referral to community resources comment:   Other social work plan:

## 2013-02-15 NOTE — Progress Notes (Signed)
UR chart review completed.  

## 2013-02-15 NOTE — Lactation Note (Signed)
This note was copied from the chart of Boy Noelle PennerJada Floyd. Lactation Consultation Note  Patient Name: Boy Noelle PennerJada Puzzo ZOXWR'UToday's Date: 02/15/2013 Reason for consult: Follow-up assessment with this experienced mom and her baby, now 2031 hours of age.  Mom denies any breastfeeding concerns.  Baby has been exclusively breastfeeding for 10-15 minutes and LATCH score=8 today, per RN assessment.  Baby's output is wnl.  LC encouraged continued cue feedings.   Maternal Data    Feeding Feeding Type: Breast Fed Length of feed: 15 min  LATCH Score/Interventions               Most recent LATCH score=8       Lactation Tools Discussed/Used   Cue feedings ad lib  Consult Status Consult Status: Follow-up Date: 02/16/13 Follow-up type: In-patient    Warrick ParisianBryant, Azrael Maddix [facility] 02/15/2013, 8:40 PM

## 2013-02-16 LAB — COMPREHENSIVE METABOLIC PANEL
ALBUMIN: 2.1 g/dL — AB (ref 3.5–5.2)
ALT: 32 U/L (ref 0–35)
AST: 40 U/L — ABNORMAL HIGH (ref 0–37)
Alkaline Phosphatase: 103 U/L (ref 39–117)
BILIRUBIN TOTAL: 0.4 mg/dL (ref 0.3–1.2)
BUN: 10 mg/dL (ref 6–23)
CHLORIDE: 106 meq/L (ref 96–112)
CO2: 25 meq/L (ref 19–32)
Calcium: 8.8 mg/dL (ref 8.4–10.5)
Creatinine, Ser: 1 mg/dL (ref 0.50–1.10)
GFR calc Af Amer: 87 mL/min — ABNORMAL LOW (ref >90)
GFR calc non Af Amer: 75 mL/min — ABNORMAL LOW (ref >90)
Glucose, Bld: 100 mg/dL — ABNORMAL HIGH (ref 70–99)
POTASSIUM: 3.7 meq/L (ref 3.7–5.3)
Sodium: 141 mEq/L (ref 137–147)
Total Protein: 6.1 g/dL (ref 6.0–8.3)

## 2013-02-16 LAB — CBC
HCT: 29.2 % — ABNORMAL LOW (ref 36.0–46.0)
Hemoglobin: 10 g/dL — ABNORMAL LOW (ref 12.0–15.0)
MCH: 27.7 pg (ref 26.0–34.0)
MCHC: 34.2 g/dL (ref 30.0–36.0)
MCV: 80.9 fL (ref 78.0–100.0)
PLATELETS: 180 10*3/uL (ref 150–400)
RBC: 3.61 MIL/uL — AB (ref 3.87–5.11)
RDW: 14.4 % (ref 11.5–15.5)
WBC: 12.2 10*3/uL — ABNORMAL HIGH (ref 4.0–10.5)

## 2013-02-16 MED ORDER — LABETALOL HCL 200 MG PO TABS
200.0000 mg | ORAL_TABLET | Freq: Once | ORAL | Status: AC
  Administered 2013-02-16: 200 mg via ORAL
  Filled 2013-02-16: qty 1

## 2013-02-16 MED ORDER — HYDROCOD POLST-CHLORPHEN POLST 10-8 MG/5ML PO LQCR
5.0000 mL | Freq: Two times a day (BID) | ORAL | Status: DC | PRN
  Administered 2013-02-16 – 2013-02-17 (×2): 5 mL via ORAL
  Filled 2013-02-16 (×2): qty 5

## 2013-02-16 NOTE — Progress Notes (Signed)
Subjective: Postpartum Day 2: Cesarean Delivery Patient reports incisional pain, tolerating PO, + flatus and no problems voiding.    Objective: Vital signs in last 24 hours: Temp:  [97.9 F (36.6 C)-99.1 F (37.3 C)] 97.9 F (36.6 C) (01/07 0545) Pulse Rate:  [88-91] 91 (01/07 0616) Resp:  [20] 20 (01/07 0545) BP: (137-161)/(85-103) 161/103 mmHg (01/07 0550) SpO2:  [97 %] 97 % (01/06 1234)  Physical Exam:  General: alert, cooperative and no distress Lochia: appropriate Uterine Fundus: firm Incision: na DVT Evaluation: No evidence of DVT seen on physical exam. No cords or calf tenderness. Trace ankle edema, symmetric and unchanged   Recent Labs  02/15/13 0639 02/16/13 0708  HGB 10.6* 10.0*  HCT 30.8* 29.2*    Assessment/Plan: Status post Cesarean section. Doing well postoperatively.  SBP>160 this am, received labetalol, continue to monitor, anticipate d/c tomorrow. Continue current care. S/p BTL  Beverely Lowdamo, Elena 02/16/2013, 7:44 AM  Seen by me also Agree with note Aviva SignsMarie L Emmarae Cowdery, CNM

## 2013-02-16 NOTE — Lactation Note (Signed)
This note was copied from the chart of Pam Pam [name]. Lactation Consultation Note Follow up visit t 52 hours of age.  Mom reports just finished feeding.  Baby is asleep on her lap.  Mom denies any pain or problems.  She reports knowing how to hand express and sees clear colostrum and swallows with feedings.  Mom to call [facility] RN for latch today.    Patient Name: Pam [name] ZOXWR'UToday's Date: 02/16/2013 Reason for consult: Follow-up assessment   Maternal Data    Feeding Feeding Type: Breast Fed Length of feed: 20 min  LATCH Score/Interventions                Intervention(s): Breastfeeding basics reviewed     Lactation Tools Discussed/Used     Consult Status Consult Status: Follow-up Date: 02/17/13 Follow-up type: In-patient    Vernita Tague, Arvella MerlesJana Lynn 02/16/2013, 5:27 PM

## 2013-02-17 MED ORDER — BENZONATATE 100 MG PO CAPS: 100.0000 mg | ORAL_CAPSULE | Freq: Three times a day (TID) | ORAL | Status: AC | PRN

## 2013-02-17 MED ORDER — IBUPROFEN 600 MG PO TABS: 600.0000 mg | ORAL_TABLET | Freq: Four times a day (QID) | ORAL | Status: AC

## 2013-02-17 MED ORDER — DIBUCAINE 1 % RE OINT: 1.0000 "application " | TOPICAL_OINTMENT | RECTAL | Status: AC | PRN

## 2013-02-17 MED ORDER — OXYCODONE-ACETAMINOPHEN 5-325 MG PO TABS: 1.0000 | ORAL_TABLET | ORAL | Status: AC | PRN

## 2013-02-17 MED ORDER — LANOLIN HYDROUS EX OINT: 1.0000 "application " | TOPICAL_OINTMENT | CUTANEOUS | Status: AC | PRN

## 2013-02-17 MED ORDER — WITCH HAZEL-GLYCERIN EX PADS: 1.0000 "application " | MEDICATED_PAD | CUTANEOUS | Status: AC | PRN

## 2013-02-17 NOTE — Discharge Instructions (Signed)
Cesarean Delivery °Care After °Refer to this sheet in the next few weeks. These instructions provide you with information on caring for yourself after your procedure. Your health care provider may also give you specific instructions. Your treatment has been planned according to current medical practices, but problems sometimes occur. Call your health care provider if you have any problems or questions after you go home. °HOME CARE INSTRUCTIONS  °· Only take over-the-counter or prescription medications as directed by your health care provider. °· Do not drink alcohol, especially if you are breastfeeding or taking medication to relieve pain. °· Do not chew or smoke tobacco. °· Continue to use good perineal care. Good perineal care includes: °· Wiping your perineum from front to back. °· Keeping your perineum clean. °· Check your surgical cut (incision) daily for increased redness, drainage, swelling, or separation of skin. °· Clean your incision gently with soap and water every day, and then pat it dry. If your health care provider says it is OK, leave the incision uncovered. Use a bandage (dressing) if the incision is draining fluid or appears irritated. If the adhesive strips across the incision do not fall off within 7 days, carefully peel them off. °· Hug a pillow when coughing or sneezing until your incision is healed. This helps to relieve pain. °· Do not use tampons or douche until your health care provider says it is okay. °· Shower, wash your hair, and take tub baths as directed by your health care provider. °· Wear a well-fitting bra that provides breast support. °· Limit wearing support panties or control-top hose. °· Drink enough fluids to keep your urine clear or pale yellow. °· Eat high-fiber foods such as whole grain cereals and breads, brown rice, beans, and fresh fruits and vegetables every day. These foods may help prevent or relieve constipation. °· Resume activities such as climbing stairs,  driving, lifting, exercising, or traveling as directed by your health care provider. °· Talk to your health care provider about resuming sexual activities. This is dependent upon your risk of infection, your rate of healing, and your comfort and desire to resume sexual activity. °· Try to have someone help you with your household activities and your newborn for at least a few days after you leave the hospital. °· Rest as much as possible. Try to rest or take a nap when your newborn is sleeping. °· Increase your activities gradually. °· Keep all of your scheduled postpartum appointments. It is very important to keep your scheduled follow-up appointments. At these appointments, your health care provider will be checking to make sure that you are healing physically and emotionally. °SEEK MEDICAL CARE IF:  °· You are passing large clots from your vagina. Save any clots to show your health care provider. °· You have a foul smelling discharge from your vagina. °· You have trouble urinating. °· You are urinating frequently. °· You have pain when you urinate. °· You have a change in your bowel movements. °· You have increasing redness, pain, or swelling near your incision. °· You have pus draining from your incision. °· Your incision is separating. °· You have painful, hard, or reddened breasts. °· You have a severe headache. °· You have blurred vision or see spots. °· You feel sad or depressed. °· You have thoughts of hurting yourself or your newborn. °· You have questions about your care, the care of your newborn, or medications. °· You are dizzy or lightheaded. °· You have a rash. °· You   have pain, redness, or swelling at the site of the removed intravenous access (IV) tube. °· You have nausea or vomiting. °· You stopped breastfeeding and have not had a menstrual period within 12 weeks of stopping. °· You are not breastfeeding and have not had a menstrual period within 12 weeks of delivery. °· You have a fever. °SEEK  IMMEDIATE MEDICAL CARE IF: °· You have persistent pain. °· You have chest pain. °· You have shortness of breath. °· You faint. °· You have leg pain. °· You have stomach pain. °· Your vaginal bleeding saturates 2 or more sanitary pads in 1 hour. °MAKE SURE YOU:  °· Understand these instructions. °· Will watch your condition. °· Will get help right away if you are not doing well or get worse. °Document Released: 10/19/2001 Document Revised: 09/29/2012 Document Reviewed: 09/24/2011 °ExitCare® Patient Information ©2014 [employer]. ° ° ° °

## 2013-02-17 NOTE — Lactation Note (Signed)
This note was copied from the chart of Boy Noelle PennerJada Priola. Lactation Consultation Note  Patient Name: Boy Noelle PennerJada Norby UJWJX'BToday's Date: 02/17/2013 Reason for consult: Follow-up assessment Per mom baby latching both breast , breast are feeling fuller.  Mom hand expresses milk well , steady flow of colostrum noted. Areola compress able and nipple erect . Baby observed with a good depth  Latch , multiply swallows, and released on his own and content, falling asleep. Per mom feeding was comfortable. Nipple appeared normal when baby released. Reviewed basics , sore nipple and engorgement prevention and tx . Instructed mom  on the hand pump and the the need to increase flange probably for the 1st week PP until the milk  Fullness settles down and possibly decrease to #24 Flange. LC resource - baby and me booklet pg 20 -25  Mom aware of the [facility] and the [facility] O/P services.     Maternal Data    Feeding Feeding Type:  (baby already latched , consistent swallowing pattern ) Length of feed: 15 min (noted a consistent pattern with swallows )  LATCH Score/Interventions Latch:  (latched with depth )  Audible Swallowing:  (multiply swallows )  Type of Nipple:  (erect nipples )  Comfort (Breast/Nipple):  (per mom comfort able with latch )     Hold (Positioning):  (baby independent with latch ) Intervention(s): Breastfeeding basics reviewed;Support Pillows;Skin to skin     Lactation Tools Discussed/Used Tools: Pump Breast pump type: Manual WIC Program: Yes (per mom ) Pump Review: Setup, frequency, and cleaning;Milk Storage Initiated by:: MAI  Date initiated:: 02/17/13   Consult Status Consult Status: Complete    Kathrin Greathouseorio, Ravleen Ries Ann 02/17/2013, 12:32 PM

## 2013-02-17 NOTE — Progress Notes (Signed)
Subjective: Postpartum Day 3: Cesarean Delivery Patient reports incisional pain (worse with ambulation), tolerating PO, + flatus, + BM and no problems voiding.    Objective: Vital signs in last 24 hours: Temp:  [98 F (36.7 C)-98.4 F (36.9 C)] 98.4 F (36.9 C) (01/08 0529) Pulse Rate:  [85-93] 86 (01/08 0529) Resp:  [20] 20 (01/08 0529) BP: (127-153)/(82-93) 142/87 mmHg (01/08 0529)  Physical Exam:  General: alert, cooperative and no distress Lochia: appropriate Uterine Fundus: firm Incision: na DVT Evaluation: No evidence of DVT seen on physical exam. No cords or calf tenderness. Trace ankle edema, symmetric and unchanged   Recent Labs  02/15/13 0639 02/16/13 0708  HGB 10.6* 10.0*  HCT 30.8* 29.2*    Assessment/Plan: Status post Cesarean section. Doing well postoperatively.  No severe range pressures in the last 24 hours, 142/87 this am, continue to monitor, Discharge home today on HCTZ for presumed chronic HTN Continue current care. S/p BTL, breastfeeding without difficulty  Beverely Lowdamo, Elena 02/17/2013, 7:18 AM

## 2013-02-17 NOTE — Discharge Summary (Signed)
Obstetric Discharge Summary Reason for Admission: cesarean section Prenatal Procedures: none Intrapartum Procedures: cesarean: low cervical, transverse and tubal ligation Postpartum Procedures: none Complications-Operative and Postpartum: P.P. eclampsia  Hospital Course: Pregnancy complicated by no prenatal care after 20 weeks and preeclampsia diagnosed at time of delivery. She was not treated with mag as she did not meet severe criteria. She had one blood pressure in the severe range on POD1 which was treated with labetalol, after which all pressures remained in the mild range. She was discharged without meds, with a plan to for BP check with baby love in 1 week and f/u in clinic at 4 weeks.  Delivery Note At 1:13 PM a viable female was delivered via C-Section, APGAR: 3, 8; weight 9 lb 1 oz (4110 g).   Placenta status: Intact, Manual removal.  Cord: 3 vessels with the following complications: .  Cord pH: 6.94  H/H: Lab Results  Component Value Date/Time   HGB 10.0* 02/16/2013  7:08 AM   HCT 29.2* 02/16/2013  7:08 AM     Discharge Diagnoses: Term Pregnancy-delivered and Preelampsia  Discharge Information: Date: 08/22/2010 Activity: pelvic rest Diet: routine  Medications: PNV, Ibuprofen, Percocet and tessalon Breast feeding:  Yes Condition: stable Instructions: refer to handout Discharge to: home       Discharge Orders   Future Orders Complete By Expires   Baby Love Nurse Visit  As directed    Scheduling Instructions:     Please schedule BP check in 1 week   Diet - low sodium heart healthy  As directed    Increase activity slowly  As directed        Medication List         benzonatate 100 MG capsule  Commonly known as:  TESSALON  Take 1 capsule (100 mg total) by mouth every 8 (eight) hours as needed for cough.     dibucaine 1 % Oint  Commonly known as:  NUPERCAINAL  Place 1 application rectally as needed for hemorrhoids.     ibuprofen 600 MG tablet  Commonly known  as:  ADVIL,MOTRIN  Take 1 tablet (600 mg total) by mouth every 6 (six) hours.     lanolin Oint  Apply 1 application topically as needed (for breast care).     oxyCODONE-acetaminophen 5-325 MG per tablet  Commonly known as:  PERCOCET/ROXICET  Take 1-2 tablets by mouth every 4 (four) hours as needed for severe pain (moderate - severe pain).     prenatal multivitamin Tabs tablet  Take 1 tablet by mouth daily at 12 noon.     vitamin C 1000 MG tablet  Take 1,000 mg by mouth once.     witch hazel-glycerin pad  Commonly known as:  TUCKS  Apply 1 application topically as needed for hemorrhoids.       Follow-up Information   Schedule an appointment as soon as possible for a visit with [facility].   Contact information:   [address] [place] [id number]       Beverely LowAdamo, Elena 02/17/2013,10:48 AM  I have seen and examined this patient and I agree with the above. Cam HaiSHAW, KIMBERLY 12:51 AM 02/19/2013

## 2013-02-19 NOTE — Discharge Summary (Signed)
`````  Attestation of Attending Supervision of Advanced Practitioner: Evaluation and management procedures were performed by the PA/NP/CNM/OB Fellow under my supervision/collaboration. Chart reviewed and agree with documentation of care and dicharge planning  Paz Fuentes V 02/19/2013 10:03 PM

## 2013-02-19 NOTE — Progress Notes (Signed)
I have seen and examined this patient and I agree with the above. See D/C summary. Cam HaiSHAW, Caralynn Gelber 12:53 AM 02/19/2013

## 2013-04-14 ENCOUNTER — Ambulatory Visit
Admission: RE | Admit: 2013-04-14 | Discharge: 2013-04-14 | Disposition: A | Discharge status: Home / Self Care | Payer: Medicaid Other | Source: Ambulatory Visit | Attending: Family Medicine | Admitting: Family Medicine

## 2013-04-14 ENCOUNTER — Other Ambulatory Visit: Payer: Self-pay | Admitting: Family Medicine

## 2013-04-14 DIAGNOSIS — R059 Cough, unspecified: Secondary | ICD-10-CM

## 2013-04-14 DIAGNOSIS — R05 Cough: Secondary | ICD-10-CM

## 2013-12-12 ENCOUNTER — Encounter (HOSPITAL_COMMUNITY): Payer: Self-pay | Admitting: Obstetrics & Gynecology

## 2015-08-08 IMAGING — CR DG CHEST 2V
2 series · 2 of 2 positions shown · non-contrast
Comparison: None.

CLINICAL DATA: Cough, shortness of breath

EXAM:
CHEST  2 VIEW

[w chest pa]
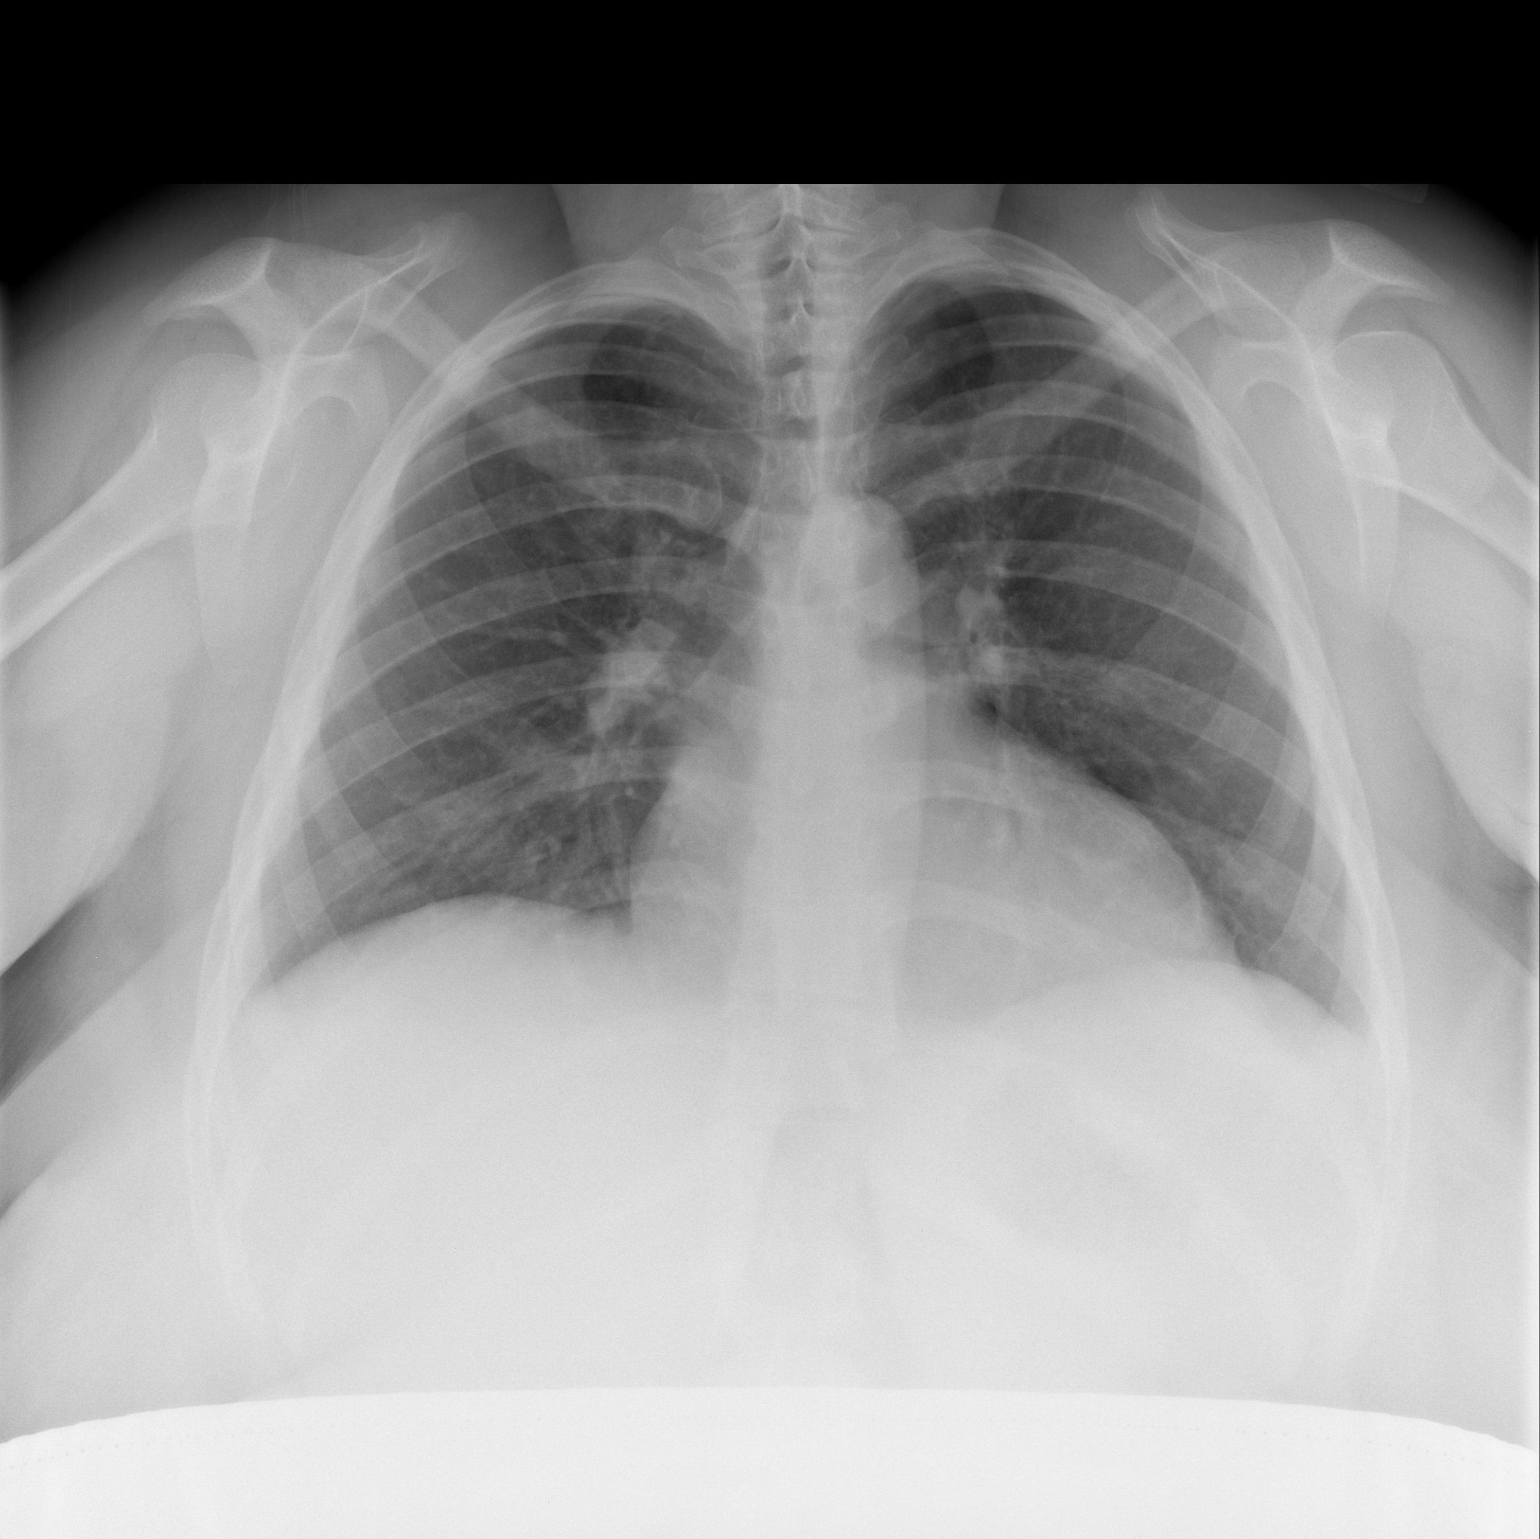

[w chest lat]
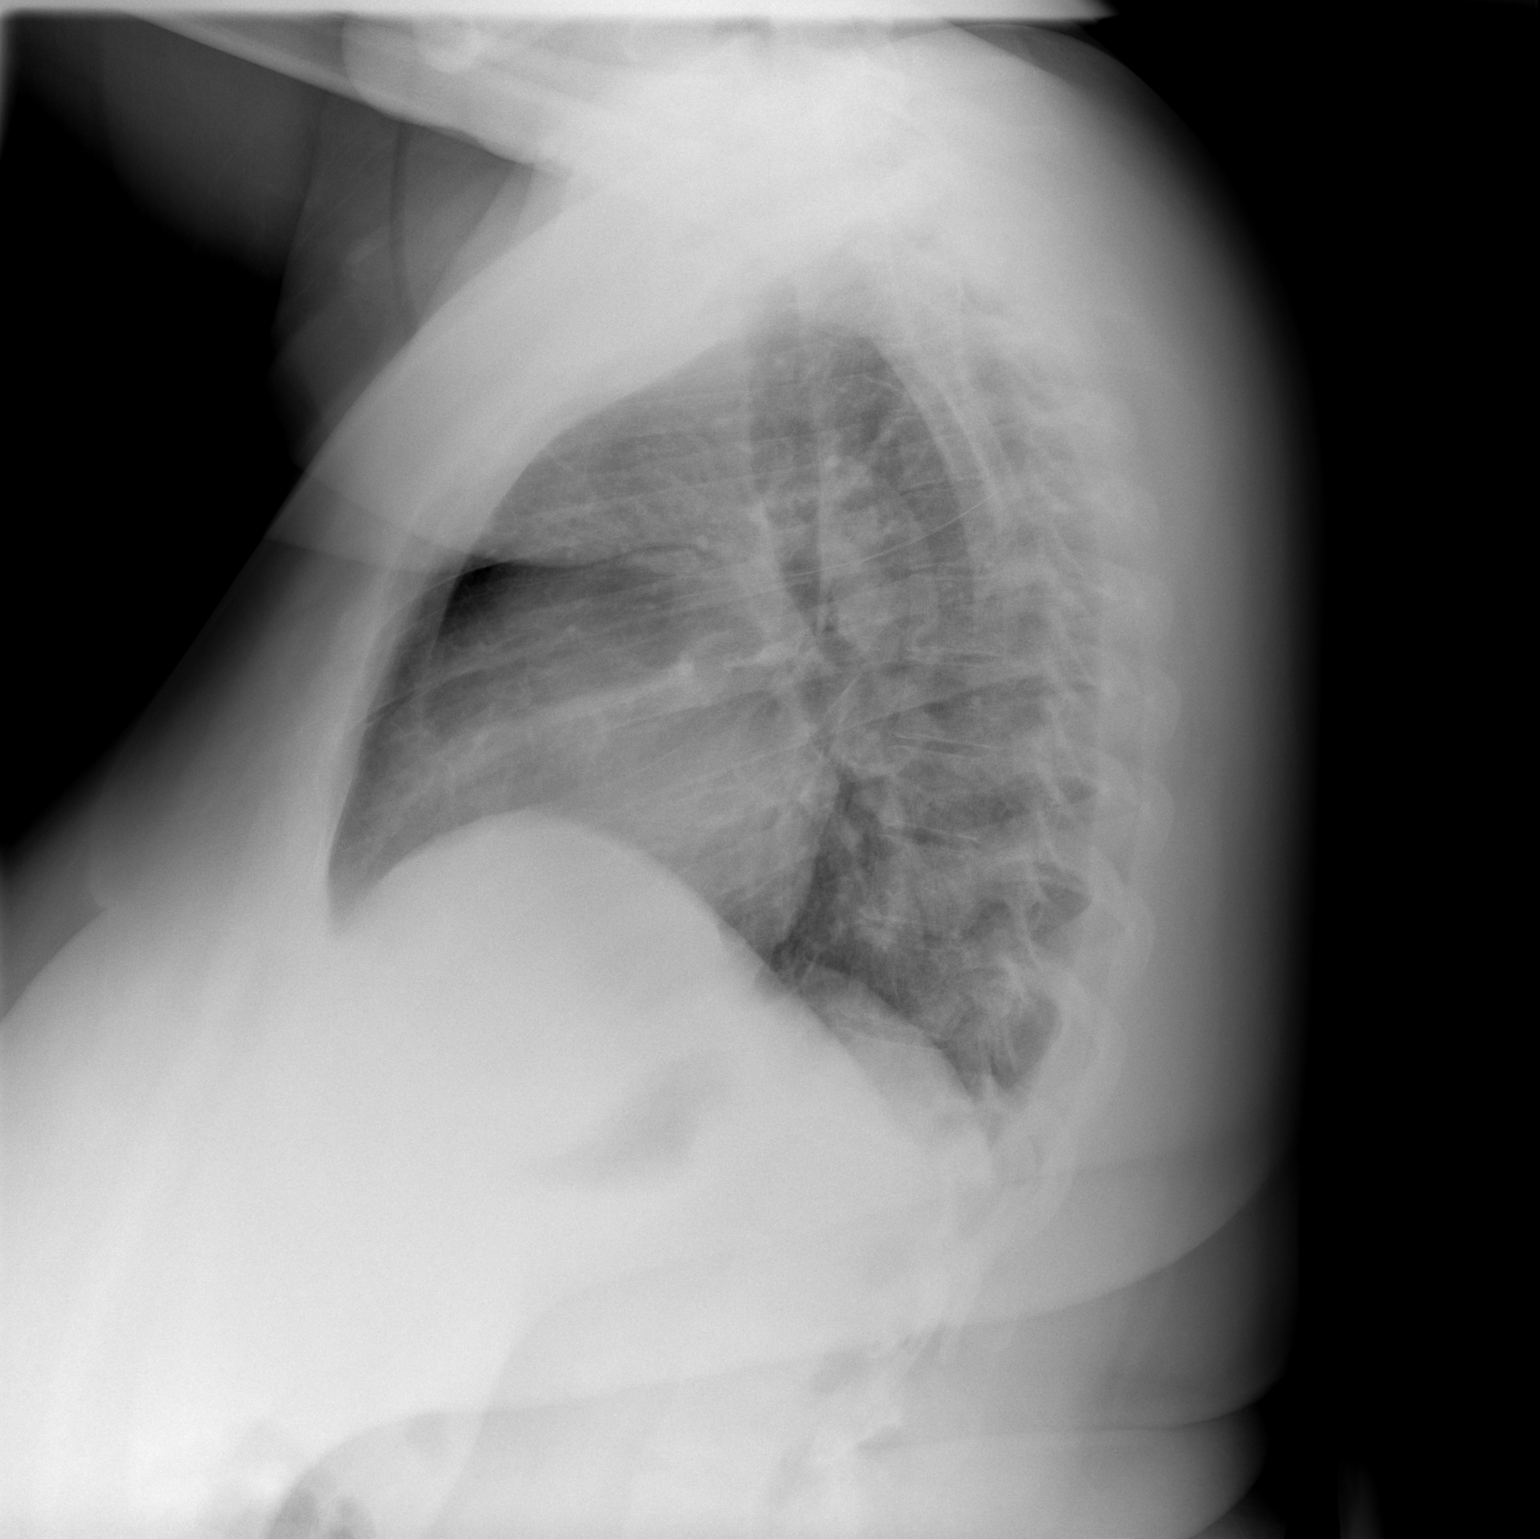

[2 of 2 positions shown; findings below may reference images not displayed]

FINDINGS: No active infiltrate or effusion is seen. There is some
peribronchial thickening which may indicate bronchitis. Mediastinal
contours are within normal limits. The heart is normal in size on
this suboptimal inspiration film. No bony abnormality is noted.
IMPRESSION: No pneumonia. Mild peribronchial thickening may indicate bronchitis.
